# Patient Record
Sex: Female | Born: 2000 | Race: White | Hispanic: No | Marital: Single | Smoking: Never smoker
Health system: Southern US, Community
[De-identification: ages and names within clinical notes are randomized; demographics above are authoritative.]

## PROBLEM LIST (undated history)

## (undated) ENCOUNTER — Inpatient Hospital Stay (HOSPITAL_COMMUNITY): Payer: Self-pay

## (undated) DIAGNOSIS — F32A Depression, unspecified: Secondary | ICD-10-CM

## (undated) DIAGNOSIS — I10 Essential (primary) hypertension: Secondary | ICD-10-CM

## (undated) DIAGNOSIS — R519 Headache, unspecified: Secondary | ICD-10-CM

## (undated) DIAGNOSIS — L0100 Impetigo, unspecified: Secondary | ICD-10-CM

## (undated) DIAGNOSIS — J45909 Unspecified asthma, uncomplicated: Secondary | ICD-10-CM

## (undated) DIAGNOSIS — F419 Anxiety disorder, unspecified: Secondary | ICD-10-CM

## (undated) DIAGNOSIS — F329 Major depressive disorder, single episode, unspecified: Secondary | ICD-10-CM

## (undated) DIAGNOSIS — R51 Headache: Secondary | ICD-10-CM

## (undated) HISTORY — PX: TONSILLECTOMY: SUR1361

---
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
## 2019-05-27 ENCOUNTER — Encounter (HOSPITAL_BASED_OUTPATIENT_CLINIC_OR_DEPARTMENT_OTHER): Payer: Self-pay | Admitting: *Deleted

## 2019-05-27 ENCOUNTER — Other Ambulatory Visit: Payer: Self-pay

## 2019-05-27 ENCOUNTER — Emergency Department (HOSPITAL_BASED_OUTPATIENT_CLINIC_OR_DEPARTMENT_OTHER)
Admission: EM | Admit: 2019-05-27 | Discharge: 2019-05-28 | Disposition: A | Discharge status: Home / Self Care | Payer: 59 | Attending: Emergency Medicine | Admitting: Emergency Medicine

## 2019-05-27 DIAGNOSIS — J45909 Unspecified asthma, uncomplicated: Secondary | ICD-10-CM | POA: Diagnosis not present

## 2019-05-27 DIAGNOSIS — R1013 Epigastric pain: Secondary | ICD-10-CM | POA: Insufficient documentation

## 2019-05-27 DIAGNOSIS — Z79899 Other long term (current) drug therapy: Secondary | ICD-10-CM | POA: Insufficient documentation

## 2019-05-27 LAB — LIPASE, BLOOD: Lipase: 17 U/L (ref 11–51)

## 2019-05-27 LAB — COMPREHENSIVE METABOLIC PANEL
ALT: 13 U/L (ref 0–44)
AST: 12 U/L — ABNORMAL LOW (ref 15–41)
Albumin: 4.1 g/dL (ref 3.5–5.0)
Alkaline Phosphatase: 82 U/L (ref 38–126)
Anion gap: 11 (ref 5–15)
BUN: 14 mg/dL (ref 6–20)
CO2: 22 mmol/L (ref 22–32)
Calcium: 8.8 mg/dL — ABNORMAL LOW (ref 8.9–10.3)
Chloride: 103 mmol/L (ref 98–111)
Creatinine, Ser: 0.75 mg/dL (ref 0.44–1.00)
GFR calc Af Amer: >60 mL/min (ref >60)
GFR calc non Af Amer: >60 mL/min (ref >60)
Glucose, Bld: 92 mg/dL (ref 70–99)
Potassium: 3.8 mmol/L (ref 3.5–5.1)
Sodium: 136 mmol/L (ref 135–145)
Total Bilirubin: 0.4 mg/dL (ref 0.3–1.2)
Total Protein: 6.8 g/dL (ref 6.5–8.1)

## 2019-05-27 LAB — URINALYSIS, ROUTINE W REFLEX MICROSCOPIC
Bilirubin Urine: NEGATIVE
Glucose, UA: NEGATIVE mg/dL
Hgb urine dipstick: NEGATIVE
Ketones, ur: NEGATIVE mg/dL
Leukocytes,Ua: NEGATIVE
Nitrite: NEGATIVE
Protein, ur: NEGATIVE mg/dL
Specific Gravity, Urine: 1.02 (ref 1.005–1.030)
pH: 6 (ref 5.0–8.0)

## 2019-05-27 LAB — PREGNANCY, URINE: Preg Test, Ur: NEGATIVE

## 2019-05-27 MED ORDER — KETOROLAC TROMETHAMINE 30 MG/ML IJ SOLN
30.0000 mg | Freq: Once | INTRAMUSCULAR | Status: DC
  Filled 2019-05-27: qty 1

## 2019-05-27 MED ORDER — DICYCLOMINE HCL 10 MG PO CAPS
10.0000 mg | ORAL_CAPSULE | Freq: Once | ORAL | Status: AC
  Administered 2019-05-28: 10 mg via ORAL
  Filled 2019-05-27: qty 1

## 2019-05-27 MED ORDER — LIDOCAINE HCL 1 % IJ SOLN
INTRAMUSCULAR | Status: AC
  Filled 2019-05-27: qty 20

## 2019-05-27 NOTE — ED Provider Notes (Signed)
[facility] EMERGENCY DEPARTMENT Provider Note   CSN: [id number] Arrival date & time: 05/27/19  2001     History Chief Complaint  Patient presents with  . Abdominal Pain    fever    [name] is a 19 y.o. female.  HPI     This is a 19 year old female with a history of anxiety and asthma who presents with abdominal pain.  Patient reports upper crampy abdominal pain that started this morning.  It comes and goes.  It does not seem to be worsened by anything including eating.  Patient currently rates her pain at 5 out of 10.  She did not take anything for pain.  She reports urinary frequency but no dysuria or hematuria.  Denies any vaginal discharge or concerns for STDs.  Did take her temperature earlier today it was 102 temporally.  Denies any sick contacts.  Denies nausea, vomiting, chest pain, shortness of breath, cough.  No Covid contacts.  She reports normal bowel movements.  Past Medical History:  Diagnosis Date  . Anxiety   . Asthma   . Depression   . Headache    hormone induced  . Impetigo     Patient Active Problem List   Diagnosis Date Noted  . Postpartum endometritis 04/25/2017  . Anemia due to acute blood loss 04/25/2017  . Status post primary low transverse cesarean section 04/24/2017  . Preeclampsia, severe 04/19/2017    Past Surgical History:  Procedure Laterality Date  . CESAREAN SECTION N/A 04/21/2017   Procedure: CESAREAN SECTION;  Surgeon: Geryl Rankins, MD;  Location: [facility] BIRTHING SUITES;  Service: Obstetrics;  Laterality: N/A;     OB History    Gravida  1   Para  1   Term  1   Preterm  0   AB  0   Living  1     SAB  0   TAB  0   Ectopic  0   Multiple  0   Live Births  1           Family History  Problem Relation Age of Onset  . Hypertension Maternal Grandmother   . Arthritis Maternal Grandmother   . Depression Maternal Grandmother   . Heart disease Maternal Grandfather   . Hypertension Maternal  Grandfather   . Anemia Mother     Social History   Tobacco Use  . Smoking status: Never Smoker  . Smokeless tobacco: Never Used  Substance Use Topics  . Alcohol use: No  . Drug use: No    Home Medications Prior to Admission medications   Medication Sig Start Date End Date Taking? Authorizing Provider  acetaminophen (TYLENOL) 500 MG tablet Take 500 mg by mouth every 6 (six) hours as needed for mild pain or headache.    [provider]  albuterol (PROVENTIL HFA;VENTOLIN HFA) 108 (90 Base) MCG/ACT inhaler Inhale 1-2 puffs into the lungs every 4 (four) hours as needed for wheezing or shortness of breath.     [provider]  ibuprofen (ADVIL,MOTRIN) 600 MG tablet Take 1 tablet (600 mg total) by mouth every 6 (six) hours as needed. 04/25/17   Nigel Bridgeman, CNM  Iron-FA-B Cmp-C-Biot-Probiotic (FUSION PLUS) CAPS Take 1 tablet by mouth daily. 04/25/17   Nigel Bridgeman, CNM  NIFEdipine (PROCARDIA-XL/ADALAT CC) 30 MG 24 hr tablet Take 1 tablet (30 mg total) by mouth daily. 04/25/17   Nigel Bridgeman, CNM  omeprazole (PRILOSEC) 20 MG capsule Take 1 capsule (20 mg total)  by mouth daily. 05/28/19   Crosby Oriordan, Mayer Masker, MD  Prenatal MV-Min-FA-Omega-3 (PRENATAL GUMMIES/DHA & FA) 0.4-32.5 MG CHEW Chew 1 each by mouth at bedtime.    [provider]    Allergies    Penicillins  Review of Systems   Review of Systems  Constitutional: Positive for fever.  Respiratory: Negative for shortness of breath.   Cardiovascular: Negative for chest pain.  Gastrointestinal: Positive for abdominal pain. Negative for constipation, nausea and vomiting.  Genitourinary: Positive for frequency. Negative for difficulty urinating, urgency and vaginal discharge.  All other systems reviewed and are negative.   Physical Exam Updated Vital Signs BP (!) 163/71 (BP Location: Right Arm)   Pulse 93   Temp 99 F (37.2 C) (Oral)   Resp 20   Ht 1.6 m (5\' 3" )   Wt 97.5 kg   LMP 05/13/2019 (Approximate)    SpO2 100%   Breastfeeding No   BMI 38.09 kg/m   Physical Exam Vitals and nursing note reviewed.  Constitutional:      Appearance: She is well-developed. She is obese. She is not ill-appearing.  HENT:     Head: Normocephalic and atraumatic.  Eyes:     Pupils: Pupils are equal, round, and reactive to light.  Cardiovascular:     Rate and Rhythm: Normal rate and regular rhythm.     Heart sounds: Normal heart sounds.  Pulmonary:     Effort: Pulmonary effort is normal. No respiratory distress.     Breath sounds: No wheezing.  Abdominal:     General: Bowel sounds are normal.     Palpations: Abdomen is soft.     Tenderness: There is abdominal tenderness in the epigastric area. There is no guarding or rebound.  Musculoskeletal:     Cervical back: Neck supple.  Skin:    General: Skin is warm and dry.  Neurological:     Mental Status: She is alert and oriented to person, place, and time.  Psychiatric:        Mood and Affect: Mood normal.     ED Results / Procedures / Treatments   Labs (all labs ordered are listed, but only abnormal results are displayed) Labs Reviewed  COMPREHENSIVE METABOLIC PANEL - Abnormal; Notable for the following components:      Result Value   Calcium 8.8 (*)    AST 12 (*)    All other components within normal limits  LIPASE, BLOOD  URINALYSIS, ROUTINE W REFLEX MICROSCOPIC  PREGNANCY, URINE  CBC    EKG None  Radiology No results found.  Procedures Procedures (including critical care time)  Medications Ordered in ED Medications  dicyclomine (BENTYL) capsule 10 mg (10 mg Oral Given 05/28/19 0011)  ketorolac (TORADOL) 30 MG/ML injection 30 mg (30 mg Intravenous Given 05/28/19 0013)    ED Course  I have reviewed the triage vital signs and the nursing notes.  Pertinent labs & imaging results that were available during my care of the patient were reviewed by me and considered in my medical decision making (see chart for details).    MDM  Rules/Calculators/A&P                       Patient presents with upper abdominal pain.  She is overall nontoxic-appearing vital signs are reassuring.  She reports fever at home.  She is afebrile here.  She has some epigastric tenderness to palpation without rebound or guarding.  Lab work reviewed.  No significant leukocytosis.  LFTs and urinalysis are reassuring.  Considerations include reflux, gastritis, pancreatitis, cholecystitis, less likely appendicitis or ovarian pathology.  She has no evidence of inflammation and feel that cholecystitis is unlikely.  Lipase is normal.  Given reported fever, patient could have a viral illness.  Patient improved with Toradol and Bentyl.  I apparently indicated to nursing vaginal discharge but declines pelvic exam and does not report any vaginal discharge to me.  We discussed starting omeprazole to see if this improves her symptoms.  Patient stated understanding.  She is given strict return precautions.  After history, exam, and medical workup I feel the patient has been appropriately medically screened and is safe for discharge home. Pertinent diagnoses were discussed with the patient. Patient was given return precautions.   Final Clinical Impression(s) / ED Diagnoses Final diagnoses:  Epigastric pain    Rx / DC Orders ED Discharge Orders         Ordered    omeprazole (PRILOSEC) 20 MG capsule  Daily     05/28/19 0114           Merryl Hacker, MD 05/28/19 [phone]

## 2019-05-27 NOTE — ED Triage Notes (Signed)
Pt reports abd cramping, vaginal discharge, and urinary frequency today. States she checked her temp with a forehead thermometer and it was 101.8 earlier today

## 2019-05-28 LAB — CBC
HCT: 37.5 % (ref 36.0–46.0)
Hemoglobin: 12.5 g/dL (ref 12.0–15.0)
MCH: 29 pg (ref 26.0–34.0)
MCHC: 33.3 g/dL (ref 30.0–36.0)
MCV: 87 fL (ref 80.0–100.0)
Platelets: 267 10*3/uL (ref 150–400)
RBC: 4.31 MIL/uL (ref 3.87–5.11)
RDW: 12.6 % (ref 11.5–15.5)
WBC: 8.2 10*3/uL (ref 4.0–10.5)
nRBC: 0 % (ref 0.0–0.2)

## 2019-05-28 MED ORDER — KETOROLAC TROMETHAMINE 30 MG/ML IJ SOLN
30.0000 mg | Freq: Once | INTRAMUSCULAR | Status: AC
  Administered 2019-05-28: 30 mg via INTRAVENOUS

## 2019-05-28 MED ORDER — OMEPRAZOLE 20 MG PO CPDR: 20.0000 mg | DELAYED_RELEASE_CAPSULE | Freq: Every day | ORAL | 0 refills | Status: DC

## 2019-05-28 NOTE — Discharge Instructions (Addendum)
You were seen today for abdominal pain.  Your abdominal pain is in the upper abdomen.  Take omeprazole daily.  See if this helps.  If you develop worsening pain, right upper quadrant pain, pain that moves to the right lower quadrant, you should be reevaluated.  Make sure that you are staying hydrated.

## 2020-08-18 ENCOUNTER — Emergency Department (HOSPITAL_COMMUNITY)
Admission: EM | Admit: 2020-08-18 | Discharge: 2020-08-19 | Disposition: A | Discharge status: Home / Self Care | Payer: Managed Care, Other (non HMO) | Attending: Emergency Medicine | Admitting: Emergency Medicine

## 2020-08-18 ENCOUNTER — Other Ambulatory Visit: Payer: Self-pay

## 2020-08-18 DIAGNOSIS — R1031 Right lower quadrant pain: Secondary | ICD-10-CM

## 2020-08-18 DIAGNOSIS — J45909 Unspecified asthma, uncomplicated: Secondary | ICD-10-CM | POA: Diagnosis not present

## 2020-08-18 LAB — I-STAT BETA HCG BLOOD, ED (MC, WL, AP ONLY): I-stat hCG, quantitative: <5 m[IU]/mL (ref <5)

## 2020-08-18 LAB — URINALYSIS, ROUTINE W REFLEX MICROSCOPIC
Bacteria, UA: NONE SEEN
Bilirubin Urine: NEGATIVE
Glucose, UA: NEGATIVE mg/dL
Hgb urine dipstick: NEGATIVE
Ketones, ur: NEGATIVE mg/dL
Nitrite: NEGATIVE
Protein, ur: NEGATIVE mg/dL
Specific Gravity, Urine: 1.01 (ref 1.005–1.030)
pH: 5 (ref 5.0–8.0)

## 2020-08-18 LAB — COMPREHENSIVE METABOLIC PANEL
ALT: 16 U/L (ref 0–44)
AST: 15 U/L (ref 15–41)
Albumin: 3.8 g/dL (ref 3.5–5.0)
Alkaline Phosphatase: 81 U/L (ref 38–126)
Anion gap: 10 (ref 5–15)
BUN: 10 mg/dL (ref 6–20)
CO2: 23 mmol/L (ref 22–32)
Calcium: 9.2 mg/dL (ref 8.9–10.3)
Chloride: 102 mmol/L (ref 98–111)
Creatinine, Ser: 0.81 mg/dL (ref 0.44–1.00)
GFR, Estimated: >60 mL/min (ref >60)
Glucose, Bld: 92 mg/dL (ref 70–99)
Potassium: 3.9 mmol/L (ref 3.5–5.1)
Sodium: 135 mmol/L (ref 135–145)
Total Bilirubin: 0.4 mg/dL (ref 0.3–1.2)
Total Protein: 6.9 g/dL (ref 6.5–8.1)

## 2020-08-18 LAB — CBC WITH DIFFERENTIAL/PLATELET
Abs Immature Granulocytes: 0.03 10*3/uL (ref 0.00–0.07)
Basophils Absolute: 0 10*3/uL (ref 0.0–0.1)
Basophils Relative: 0 %
Eosinophils Absolute: 0 10*3/uL (ref 0.0–0.5)
Eosinophils Relative: 0 %
HCT: 37.9 % (ref 36.0–46.0)
Hemoglobin: 12.4 g/dL (ref 12.0–15.0)
Immature Granulocytes: 0 %
Lymphocytes Relative: 13 %
Lymphs Abs: 1.3 10*3/uL (ref 0.7–4.0)
MCH: 28.7 pg (ref 26.0–34.0)
MCHC: 32.7 g/dL (ref 30.0–36.0)
MCV: 87.7 fL (ref 80.0–100.0)
Monocytes Absolute: 1 10*3/uL (ref 0.1–1.0)
Monocytes Relative: 10 %
Neutro Abs: 7.9 10*3/uL — ABNORMAL HIGH (ref 1.7–7.7)
Neutrophils Relative %: 77 %
Platelets: 239 10*3/uL (ref 150–400)
RBC: 4.32 MIL/uL (ref 3.87–5.11)
RDW: 12.8 % (ref 11.5–15.5)
WBC: 10.2 10*3/uL (ref 4.0–10.5)
nRBC: 0 % (ref 0.0–0.2)

## 2020-08-18 LAB — LIPASE, BLOOD: Lipase: 24 U/L (ref 11–51)

## 2020-08-18 MED ORDER — ONDANSETRON HCL 4 MG/2ML IJ SOLN
4.0000 mg | Freq: Once | INTRAMUSCULAR | Status: AC
  Administered 2020-08-18: 4 mg via INTRAVENOUS
  Filled 2020-08-18: qty 2

## 2020-08-18 MED ORDER — SODIUM CHLORIDE 0.9 % IV BOLUS
1000.0000 mL | Freq: Once | INTRAVENOUS | Status: AC
  Administered 2020-08-18: 1000 mL via INTRAVENOUS

## 2020-08-18 MED ORDER — ACETAMINOPHEN 325 MG PO TABS
650.0000 mg | ORAL_TABLET | Freq: Once | ORAL | Status: AC
  Administered 2020-08-18: 650 mg via ORAL
  Filled 2020-08-18: qty 2

## 2020-08-18 NOTE — ED Triage Notes (Signed)
Patient reports RLQ pain with fever, chills , nausea and headache onset Thursday , seen at an urgent care sent here to rule out appendicitis .

## 2020-08-18 NOTE — ED Provider Notes (Signed)
[facility] EMERGENCY DEPARTMENT Provider Note   CSN: [id number] Arrival date & time: 08/18/20  1834     History Chief Complaint  Patient presents with   Abdominal Pain    [name] is a 20 y.o. female.  20 year old female presents to the emergency department for evaluation of lower abdominal pain.  She has been having pain for the past few days.  Reports constant pain which has been gradually worsening.  She went to urgent care who encouraged ED assessment given concern for appendicitis.  The patient states that she has been having frequent nausea.  Had a fever last night.  Currently complaining of a headache.  No vomiting, diarrhea, dysuria, hematuria. SHx significant for C-section. Secondary amenorrhea 2/2 IUD.   Abdominal Pain     Past Medical History:  Diagnosis Date   Anxiety    Asthma    Depression    Headache    hormone induced   Impetigo     Patient Active Problem List   Diagnosis Date Noted   Postpartum endometritis 04/25/2017   Anemia due to acute blood loss 04/25/2017   Status post primary low transverse cesarean section 04/24/2017   Preeclampsia, severe 04/19/2017    Past Surgical History:  Procedure Laterality Date   CESAREAN SECTION N/A 04/21/2017   Procedure: CESAREAN SECTION;  Surgeon: Geryl Rankins, MD;  Location: [facility] BIRTHING SUITES;  Service: Obstetrics;  Laterality: N/A;     OB History     Gravida  1   Para  1   Term  1   Preterm  0   AB  0   Living  1      SAB  0   IAB  0   Ectopic  0   Multiple  0   Live Births  1           Family History  Problem Relation Age of Onset   Hypertension Maternal Grandmother    Arthritis Maternal Grandmother    Depression Maternal Grandmother    Heart disease Maternal Grandfather    Hypertension Maternal Grandfather    Anemia Mother     Social History   Tobacco Use   Smoking status: Never   Smokeless tobacco: Never  Substance Use Topics    Alcohol use: No   Drug use: No    Home Medications Prior to Admission medications   Medication Sig Start Date End Date Taking? Authorizing Provider  acetaminophen (TYLENOL) 500 MG tablet Take 500 mg by mouth every 6 (six) hours as needed for mild pain or headache.   Yes [provider]  albuterol (PROVENTIL HFA;VENTOLIN HFA) 108 (90 Base) MCG/ACT inhaler Inhale 1-2 puffs into the lungs every 4 (four) hours as needed for wheezing or shortness of breath.    Yes [provider]  busPIRone (BUSPAR) 5 MG tablet Take 5 mg by mouth in the morning and at bedtime. 08/12/20 09/11/20 Yes [provider]  levonorgestrel (MIRENA, 52 MG,) 20 MCG/DAY IUD Mirena 20 mcg/24 hours (7 yrs) 52 mg intrauterine device  Take 1 device by intrauterine route.  pt tolerated well   Yes [provider]  naproxen (NAPROSYN) 500 MG tablet Take 1 tablet (500 mg total) by mouth every 12 (twelve) hours as needed for moderate pain or mild pain (Take with food). 08/19/20  Yes [name], PA-C  ondansetron (ZOFRAN ODT) 4 MG disintegrating tablet Take 1 tablet (4 mg total) by mouth every 8 (eight) hours as needed for  nausea or vomiting. 08/19/20  Yes [name], PA-C  NIFEdipine (PROCARDIA-XL/ADALAT CC) 30 MG 24 hr tablet Take 1 tablet (30 mg total) by mouth daily. Patient not taking: No sig reported 04/25/17   Nigel Bridgeman, CNM    Allergies    Penicillins  Review of Systems   Review of Systems  Gastrointestinal:  Positive for abdominal pain.  Ten systems reviewed and are negative for acute change, except as noted in the HPI.    Physical Exam Updated Vital Signs BP 115/62 (BP Location: Right Arm)   Pulse 96   Temp 99.6 F (37.6 C) (Oral)   Resp 16   Ht 5\' 3"  (1.6 m)   Wt 120 kg   SpO2 98%   BMI 46.86 kg/m   Physical Exam Vitals and nursing note reviewed.  Constitutional:      General: She is not in acute distress.    Appearance: She is well-developed. She is not diaphoretic.      Comments: Nontoxic appearing and in NAD  HENT:     Head: Normocephalic and atraumatic.  Eyes:     General: No scleral icterus.    Conjunctiva/sclera: Conjunctivae normal.  Pulmonary:     Effort: Pulmonary effort is normal. No respiratory distress.     Comments: Respirations even and unlabored Abdominal:     Palpations: There is no mass.     Tenderness: There is abdominal tenderness.     Comments: RLQ and suprapubic TTP. Abdomen soft, obese. No peritoneal signs.  Musculoskeletal:        General: Normal range of motion.     Cervical back: Normal range of motion.  Skin:    General: Skin is warm and dry.     Coloration: Skin is not pale.     Findings: No erythema or rash.  Neurological:     Mental Status: She is alert and oriented to person, place, and time.     Coordination: Coordination normal.  Psychiatric:        Behavior: Behavior normal.    ED Results / Procedures / Treatments   Labs (all labs ordered are listed, but only abnormal results are displayed) Labs Reviewed  CBC WITH DIFFERENTIAL/PLATELET - Abnormal; Notable for the following components:      Result Value   Neutro Abs 7.9 (*)    All other components within normal limits  URINALYSIS, ROUTINE W REFLEX MICROSCOPIC - Abnormal; Notable for the following components:   APPearance HAZY (*)    Leukocytes,Ua SMALL (*)    All other components within normal limits  COMPREHENSIVE METABOLIC PANEL  LIPASE, BLOOD  I-STAT BETA HCG BLOOD, ED ([facility], [facility], [facility] ONLY)    EKG None  Radiology CT ABDOMEN PELVIS W CONTRAST  Result Date: 08/19/2020 CLINICAL DATA:  Right lower quadrant abdominal pain EXAM: CT ABDOMEN AND PELVIS WITH CONTRAST TECHNIQUE: Multidetector CT imaging of the abdomen and pelvis was performed using the standard protocol following bolus administration of intravenous contrast. CONTRAST:  08/21/2020 OMNIPAQUE IOHEXOL 300 MG/ML  SOLN COMPARISON:  None. FINDINGS: Lower chest: No acute abnormality. Hepatobiliary: No  focal liver abnormality is seen. No gallstones, gallbladder wall thickening, or biliary dilatation. Pancreas: Unremarkable Spleen: Unremarkable Adrenals/Urinary Tract: Adrenal glands are unremarkable. Kidneys are normal, without renal calculi, focal lesion, or hydronephrosis. Bladder is unremarkable. Stomach/Bowel: The stomach, small bowel, and large bowel are unremarkable. No evidence of obstruction or focal inflammation. The appendix is normal. There is shotty mesenteric adenopathy within the ileocecal lymph node group, possibly reactive in  nature. No frankly pathologic mesenteric adenopathy identified. No free intraperitoneal gas or fluid. Vascular/Lymphatic: No pathologic adenopathy within the abdomen and pelvis. The abdominal vasculature is unremarkable. Reproductive: Intrauterine device in expected position. The pelvic organs are otherwise unremarkable. Other: Tiny fat containing umbilical hernia. Musculoskeletal: No acute bone abnormality. IMPRESSION: Shotty adenopathy within the ileocecal lymph node group within the right lower quadrant of the abdomen, possibly reactive in nature. No frankly pathologic adenopathy. Normal appendix. Electronically Signed   By: Helyn Numbers MD   On: 08/19/2020 00:42    Procedures Procedures   Medications Ordered in ED Medications  acetaminophen (TYLENOL) tablet 650 mg (650 mg Oral Given 08/18/20 2302)  sodium chloride 0.9 % bolus 1,000 mL (0 mLs Intravenous Stopped 08/19/20 0150)  ondansetron (ZOFRAN) injection 4 mg (4 mg Intravenous Given 08/18/20 2302)  iohexol (OMNIPAQUE) 300 MG/ML solution 100 mL (100 mLs Intravenous Contrast Given 08/19/20 0032)  ketorolac (TORADOL) 15 MG/ML injection 15 mg (15 mg Intravenous Given 08/19/20 0056)    ED Course  I have reviewed the triage vital signs and the nursing notes.  Pertinent labs & imaging results that were available during my care of the patient were reviewed by me and considered in my medical decision making (see  chart for details).    MDM Rules/Calculators/A&P                          20 year old female presents to the emergency department for evaluation of lower abdominal pain.  Was advised to come to the ED after urgent care evaluation to rule out appendicitis.  Noted to have focal tenderness in the lower abdomen.  Laboratory evaluation and urinalysis today are reassuring.  CT ordered following medical screening in triage.  This shows findings consistent with adenitis.  Pregnancy is negative ruling out ectopic.  Chronicity of her symptoms makes ovarian torsion unlikely.  Her pelvic organs  are without acute change.  She is feeling better after receiving fluids, Zofran, Toradol.  Counseled on outpatient use of NSAIDs as well as primary care follow-up.   Final Clinical Impression(s) / ED Diagnoses Final diagnoses:  Right lower quadrant abdominal pain    Rx / DC Orders ED Discharge Orders          Ordered    naproxen (NAPROSYN) 500 MG tablet  Every 12 hours PRN        08/19/20 0142    ondansetron (ZOFRAN ODT) 4 MG disintegrating tablet  Every 8 hours PRN        08/19/20 0142             [name], PA-C 08/19/20 0201    Palumbo, April, MD 08/19/20 0205

## 2020-08-18 NOTE — ED Provider Notes (Signed)
Emergency Medicine Provider Triage Evaluation Note  [name] , a 20 y.o. female  was evaluated in triage.  Pt complains of ad pain for the last several days. Reports fevers and nausea.  Review of Systems  Positive: Abd pain, fever, nausea Negative: Vomiting, diarrhea  Physical Exam  There were no vitals taken for this visit. Gen:   Awake, no distress   Resp:  Normal effort  MSK:   Moves extremities without difficulty  Other:  Rlq ttp  Medical Decision Making  Medically screening exam initiated at 7:22 PM.  Appropriate orders placed.  [name] was informed that the remainder of the evaluation will be completed by another provider, this initial triage assessment does not replace that evaluation, and the importance of remaining in the ED until their evaluation is complete.     [name] 08/18/20 1923    Tracy Files, MD 08/19/20 1019

## 2020-08-19 ENCOUNTER — Emergency Department (HOSPITAL_COMMUNITY): Payer: Managed Care, Other (non HMO)

## 2020-08-19 MED ORDER — IOHEXOL 300 MG/ML  SOLN
100.0000 mL | Freq: Once | INTRAMUSCULAR | Status: AC | PRN
  Administered 2020-08-19: 100 mL via INTRAVENOUS

## 2020-08-19 MED ORDER — ONDANSETRON 4 MG PO TBDP: 4.0000 mg | ORAL_TABLET | Freq: Three times a day (TID) | ORAL | 0 refills | Status: DC | PRN

## 2020-08-19 MED ORDER — KETOROLAC TROMETHAMINE 15 MG/ML IJ SOLN
15.0000 mg | Freq: Once | INTRAMUSCULAR | Status: AC
  Administered 2020-08-19: 15 mg via INTRAVENOUS
  Filled 2020-08-19: qty 1

## 2020-08-19 MED ORDER — NAPROXEN 500 MG PO TABS: 500.0000 mg | ORAL_TABLET | Freq: Two times a day (BID) | ORAL | 0 refills | Status: DC | PRN

## 2020-08-19 NOTE — Discharge Instructions (Addendum)
We recommend Naproxen for management of pain. Take Zofran for nausea/vomiting. Follow up with a primary care doctor.

## 2020-08-19 NOTE — ED Notes (Signed)
Patient transported to CT 

## 2020-12-07 ENCOUNTER — Other Ambulatory Visit: Payer: Self-pay

## 2020-12-07 DIAGNOSIS — I10 Essential (primary) hypertension: Secondary | ICD-10-CM | POA: Insufficient documentation

## 2020-12-07 DIAGNOSIS — J45909 Unspecified asthma, uncomplicated: Secondary | ICD-10-CM | POA: Insufficient documentation

## 2020-12-07 DIAGNOSIS — Z7951 Long term (current) use of inhaled steroids: Secondary | ICD-10-CM | POA: Insufficient documentation

## 2020-12-07 DIAGNOSIS — R519 Headache, unspecified: Secondary | ICD-10-CM | POA: Diagnosis present

## 2020-12-08 ENCOUNTER — Emergency Department (HOSPITAL_BASED_OUTPATIENT_CLINIC_OR_DEPARTMENT_OTHER)
Admission: EM | Admit: 2020-12-08 | Discharge: 2020-12-08 | Disposition: A | Discharge status: Home / Self Care | Payer: Managed Care, Other (non HMO) | Attending: Emergency Medicine | Admitting: Emergency Medicine

## 2020-12-08 ENCOUNTER — Encounter (HOSPITAL_BASED_OUTPATIENT_CLINIC_OR_DEPARTMENT_OTHER): Payer: Self-pay | Admitting: *Deleted

## 2020-12-08 DIAGNOSIS — R030 Elevated blood-pressure reading, without diagnosis of hypertension: Secondary | ICD-10-CM

## 2020-12-08 NOTE — ED Provider Notes (Signed)
[facility] Provider Note: Tracy Dell, MD, FACEP  CSN: [id number] MRN: [id number] ARRIVAL: 12/07/20 at 2350 ROOM: MH05/MH05   CHIEF COMPLAINT  Hypertension   HISTORY OF PRESENT ILLNESS  12/08/20 2:38 AM [name] is a 20 y.o. female who has been having headaches for about the past 2 weeks.  The headaches are frontal and not severe.  They are amenable to over-the-counter analgesics.  Nevertheless she has been taking her blood pressure more frequently out of concern that her blood pressure may be elevated and causing her headaches.  Her blood pressure was as high as 154/99 at home (she had been checking it about every 15 minutes) and she became concerned and came to the ED.  Although the nurses notes indicate she has been having chest pain she states this is really a discomfort in her upper chest/lower neck that accompanies her headaches and she acknowledges it may be more of a sensation of anxiety than actual pain.  She is not having chest pain or shortness of breath at the present time.  On recheck her blood pressure is 122/56 in the emergency department.   Past Medical History:  Diagnosis Date   Anxiety    Asthma    Depression    Headache    hormone induced   Impetigo     Past Surgical History:  Procedure Laterality Date   CESAREAN SECTION N/A 04/21/2017   Procedure: CESAREAN SECTION;  Surgeon: Geryl Rankins, MD;  Location: [facility];  Service: Obstetrics;  Laterality: N/A;   TONSILLECTOMY      Family History  Problem Relation Age of Onset   Hypertension Maternal Grandmother    Arthritis Maternal Grandmother    Depression Maternal Grandmother    Heart disease Maternal Grandfather    Hypertension Maternal Grandfather    Anemia Mother     Social History   Tobacco Use   Smoking status: Never   Smokeless tobacco: Never  Vaping Use   Vaping Use: Every day  Substance Use Topics   Alcohol use: No   Drug use: No    Prior to  Admission medications   Medication Sig Start Date End Date Taking? Authorizing Provider  acetaminophen (TYLENOL) 500 MG tablet Take 500 mg by mouth every 6 (six) hours as needed for mild pain or headache.    [provider]  albuterol (PROVENTIL HFA;VENTOLIN HFA) 108 (90 Base) MCG/ACT inhaler Inhale 1-2 puffs into the lungs every 4 (four) hours as needed for wheezing or shortness of breath.     [provider]  levonorgestrel (MIRENA, 52 MG,) 20 MCG/DAY IUD Mirena 20 mcg/24 hours (7 yrs) 52 mg intrauterine device  Take 1 device by intrauterine route.  pt tolerated well    [provider]  naproxen (NAPROSYN) 500 MG tablet Take 1 tablet (500 mg total) by mouth every 12 (twelve) hours as needed for moderate pain or mild pain (Take with food). 08/19/20   [name], PA-C  ondansetron (ZOFRAN ODT) 4 MG disintegrating tablet Take 1 tablet (4 mg total) by mouth every 8 (eight) hours as needed for nausea or vomiting. 08/19/20   [name], PA-C    Allergies Penicillins   REVIEW OF SYSTEMS  Negative except as noted here or in the History of Present Illness.   PHYSICAL EXAMINATION  Initial Vital Signs Blood pressure 133/72, pulse 90, temperature 98.7 F (37.1 C), temperature source Oral, resp. rate 18, height 5\' 3"  (1.6 m), weight 114.3 kg, last menstrual  period 11/24/2020, SpO2 97 %.  Examination General: Well-developed, well-nourished female in no acute distress; appearance consistent with age of record HENT: normocephalic; atraumatic Eyes: pupils equal, round and reactive to light; extraocular muscles intact Neck: supple Heart: regular rate and rhythm Lungs: clear to auscultation bilaterally Abdomen: soft; nondistended; nontender; bowel sounds present Extremities: No deformity; full range of motion; pulses normal Neurologic: Awake, alert and oriented; motor function intact in all extremities and symmetric; no facial droop Skin: Warm and dry Psychiatric:  Normal mood and affect   RESULTS  Summary of this visit's results, reviewed and interpreted by myself:   EKG Interpretation  Date/Time:  Monday December 08 2020 00:02:26 EDT Ventricular Rate:  100 PR Interval:  142 QRS Duration: 90 QT Interval:  356 QTC Calculation: 459 R Axis:   69 Text Interpretation: Normal sinus rhythm Cannot rule out Anterior infarct , age undetermined Abnormal ECG No previous ECGs available Confirmed by Paula Libra ([id number]) on 12/08/2020 12:14:52 AM       Laboratory Studies: No results found for this or any previous visit (from the past 24 hour(s)). Imaging Studies: No results found.  ED COURSE and MDM  Nursing notes, initial and subsequent vitals signs, including pulse oximetry, reviewed and interpreted by myself.  Vitals:   12/08/20 0200 12/08/20 0215 12/08/20 0230 12/08/20 0245  BP: (!) 119/54 121/68 118/65 (!) 122/56  Pulse: 88 77 76 87  Resp:    18  Temp:      TempSrc:      SpO2: 98% 99% 98% 100%  Weight:      Height:       Medications - No data to display  The patient declined lab work stating that she plans to follow-up with her PCP for further evaluation.  She was advised to take her blood pressure on a regular basis and to keep a record of her readings.  She was advised not to worry over her blood pressure as this can in fact drive blood pressure up.  Also her headaches may be causing the blood pressure to go up as a response to pain rather than vice versa.  PROCEDURES  Procedures   ED DIAGNOSES     ICD-10-CM   1. Elevated blood pressure reading without diagnosis of hypertension  R03.0          [name], MD 12/08/20 [phone]

## 2020-12-08 NOTE — ED Notes (Signed)
Patient verbalizes understanding of discharge instructions. Opportunity for questioning and answers were provided. Armband removed by staff, pt discharged from ED. Ambulated out to lobby  

## 2020-12-08 NOTE — ED Triage Notes (Addendum)
Pt reports recurrent chest pain x 2 weeks. States BP has been elevated (154/99). Reports she has also been having headaches

## 2022-10-19 IMAGING — CT CT ABD-PELV W/ CM
2 of 4 series · 17 of 46 positions shown, 19 images · IV contrast (Omni 300)
Comparison: None.

CLINICAL DATA: Right lower quadrant abdominal pain

EXAM:
CT ABDOMEN AND PELVIS WITH CONTRAST
TECHNIQUE: Multidetector CT imaging of the abdomen and pelvis was performed
using the standard protocol following bolus administration of
intravenous contrast.
CONTRAST:  100mL OMNIPAQUE IOHEXOL 300 MG/ML  SOLN

[Series 3: a/p w/ 5mm · axial · 0.84mm/px · z∈[-535,-100]mm · 14 of 97 slices shown, 16 images]
[im 5/97  soft-tissue]
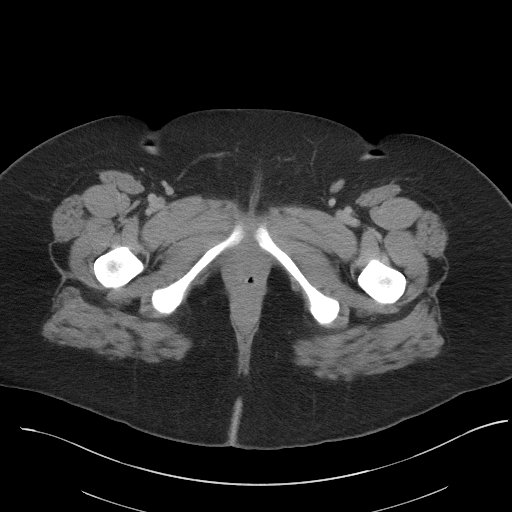
[im 5/97  bone]
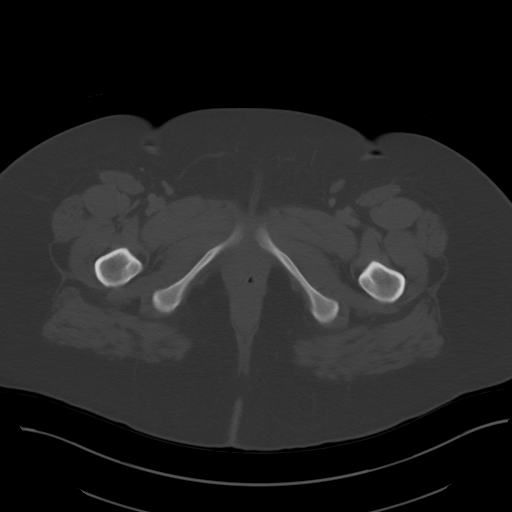
[im 13/97  soft-tissue]
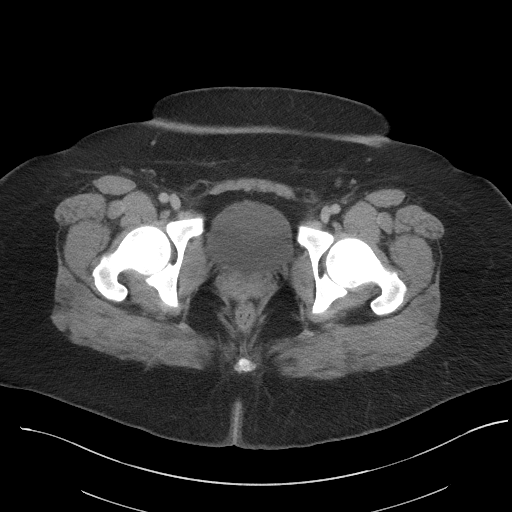
[im 17/97  soft-tissue]
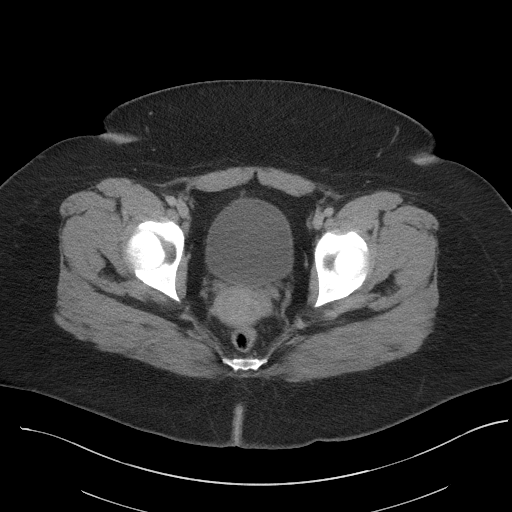
[im 26/97  soft-tissue]
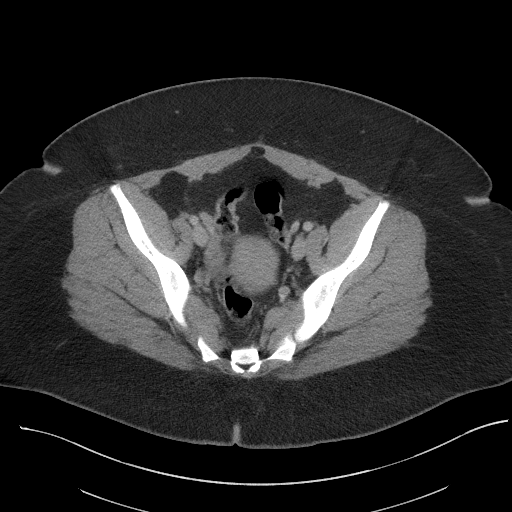
[im 34/97  soft-tissue]
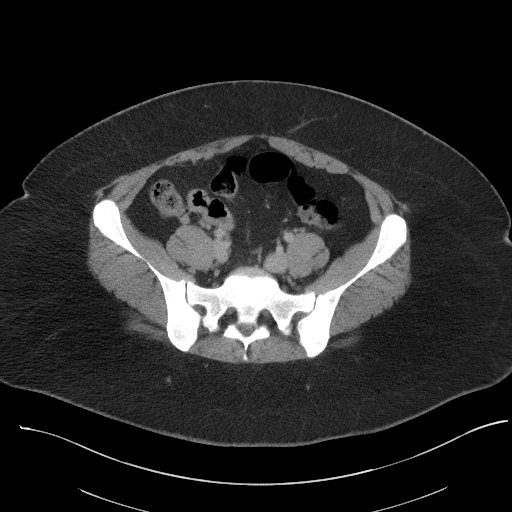
[im 38/97  soft-tissue]
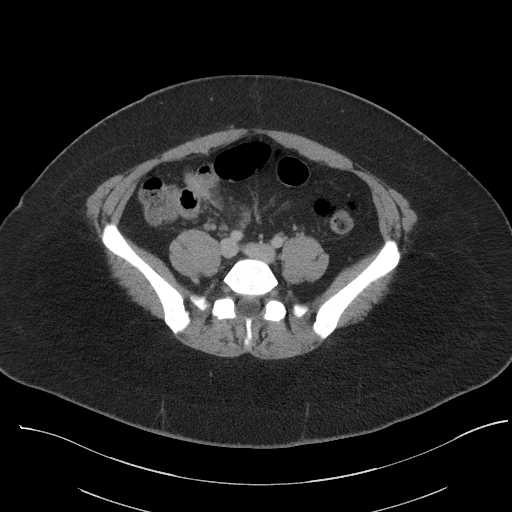
[im 46/97  soft-tissue]
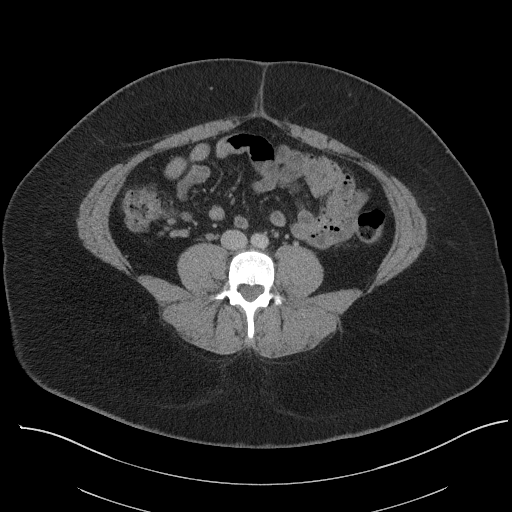
[im 51/97  soft-tissue]
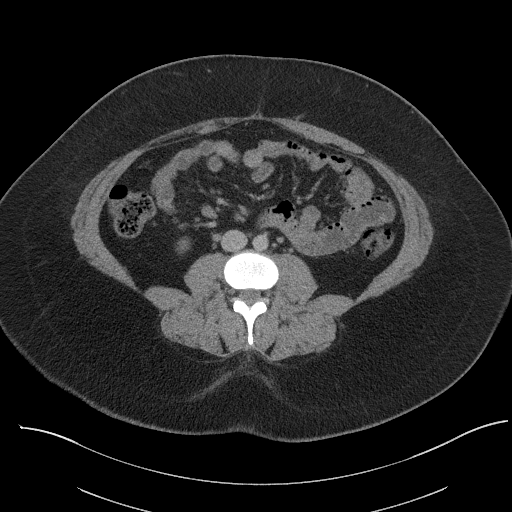
[im 59/97  soft-tissue]
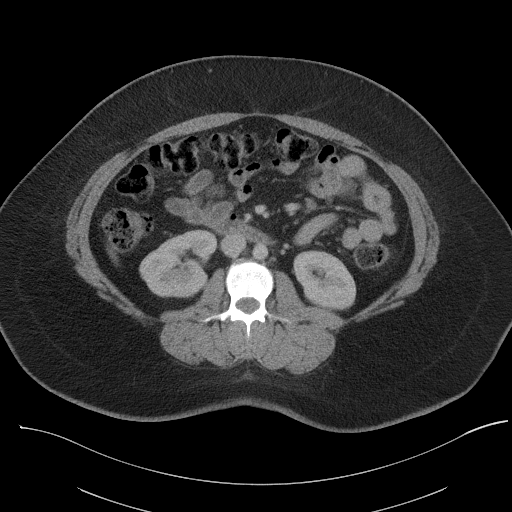
[im 59/97  bone]
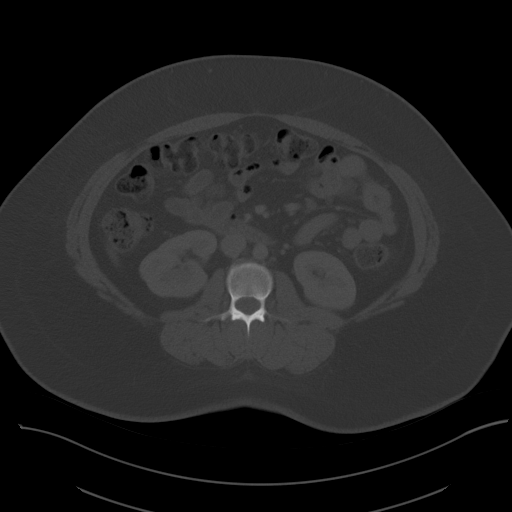
[im 63/97  soft-tissue]
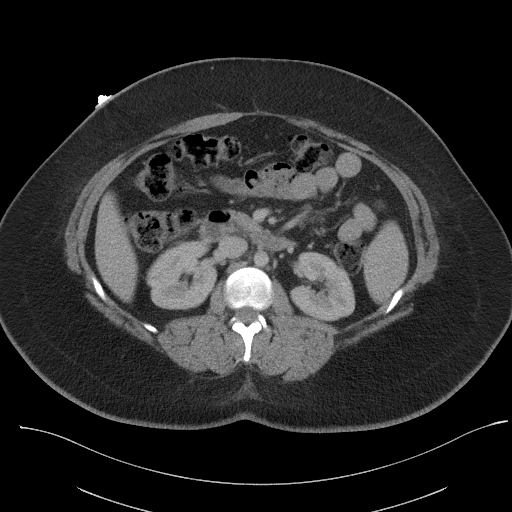
[im 71/97  soft-tissue]
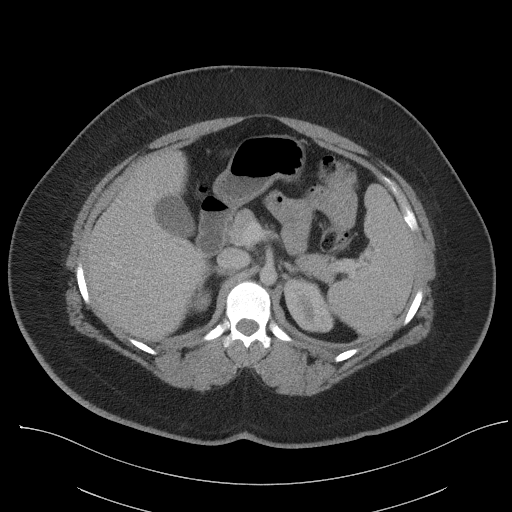
[im 80/97  soft-tissue]
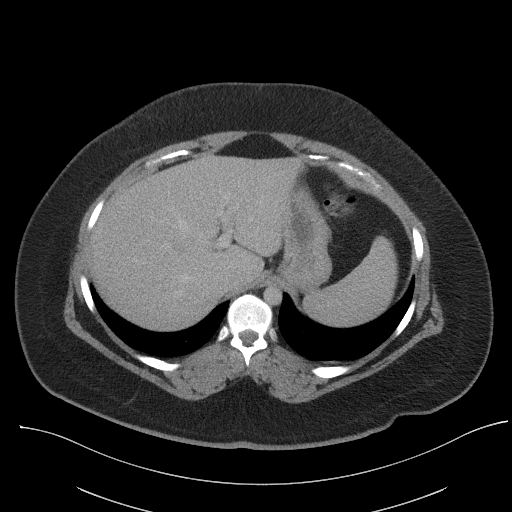
[im 84/97  soft-tissue]
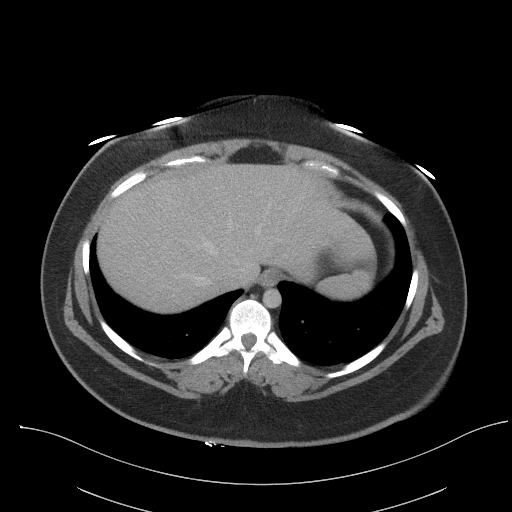
[im 92/97  soft-tissue]
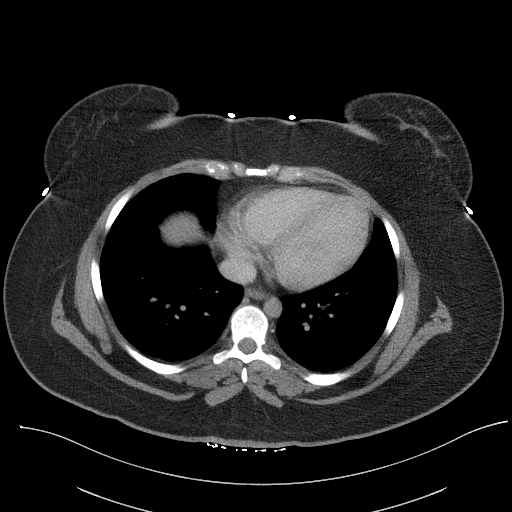

[Series 6: a/p w/ cor · coronal · 0.93mm/px · 3 of 163 slices shown]
[im 55/163  soft-tissue]
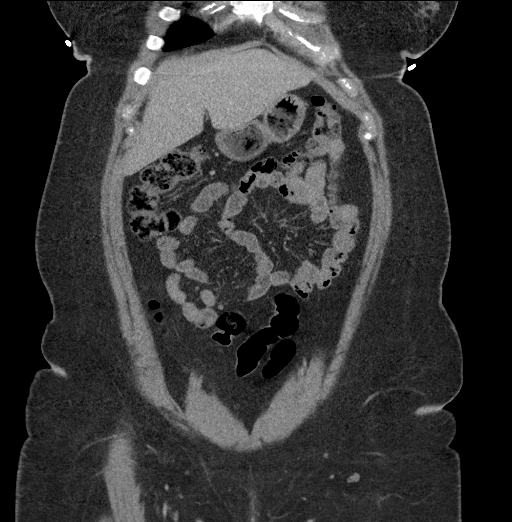
[im 73/163  soft-tissue]
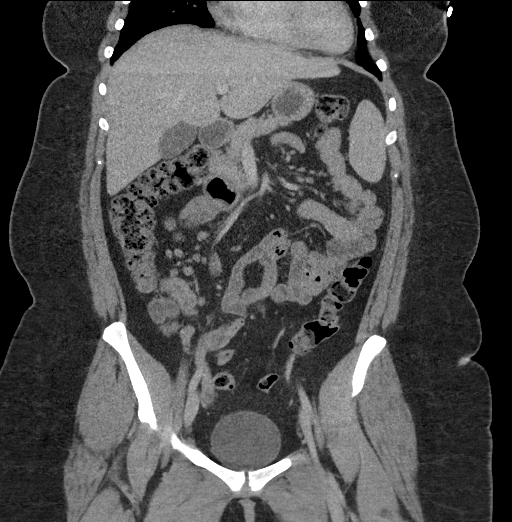
[im 91/163  soft-tissue]
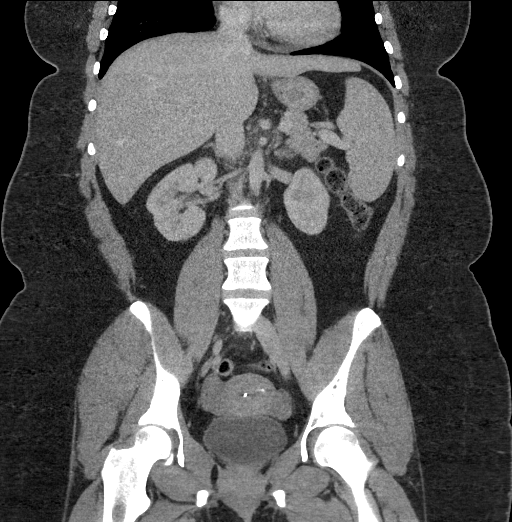

[17 of 46 positions shown; findings below may reference images not displayed]

FINDINGS: Lower chest: No acute abnormality.

Hepatobiliary: No focal liver abnormality is seen. No gallstones,
gallbladder wall thickening, or biliary dilatation.

Pancreas: Unremarkable

Spleen: Unremarkable

Adrenals/Urinary Tract: Adrenal glands are unremarkable. Kidneys are
normal, without renal calculi, focal lesion, or hydronephrosis.
Bladder is unremarkable.

Stomach/Bowel: The stomach, small bowel, and large bowel are
unremarkable. No evidence of obstruction or focal inflammation. The
appendix is normal. There is shotty mesenteric adenopathy within the
ileocecal lymph node group, possibly reactive in nature. No frankly
pathologic mesenteric adenopathy identified. No free intraperitoneal
gas or fluid.

Vascular/Lymphatic: No pathologic adenopathy within the abdomen and
pelvis. The abdominal vasculature is unremarkable.

Reproductive: Intrauterine device in expected position. The pelvic
organs are otherwise unremarkable.

Other: Tiny fat containing umbilical hernia.

Musculoskeletal: No acute bone abnormality.
IMPRESSION: Shotty adenopathy within the ileocecal lymph node group within the
right lower quadrant of the abdomen, possibly reactive in nature. No
frankly pathologic adenopathy. Normal appendix.

## 2023-10-31 NOTE — Procedures (Signed)
 Interventional Radiology Procedure Note  Procedure: lumbar puncture  Pre-Procedure Diagnosis: optic disc edema  Post-Procedure Diagnosis:  optic disc edema  Anesthesia: 1% lidocaine   EBL: Minimal  Contrast: none  Complications: None  Specimens: None  Plan: Dc to home after post procedure nursing care

## 2023-11-02 ENCOUNTER — Other Ambulatory Visit: Payer: Self-pay

## 2023-11-02 ENCOUNTER — Encounter (HOSPITAL_BASED_OUTPATIENT_CLINIC_OR_DEPARTMENT_OTHER): Payer: Self-pay

## 2023-11-02 ENCOUNTER — Emergency Department (HOSPITAL_COMMUNITY): Admission: EM | Admit: 2023-11-02 | Discharge: 2023-11-02 | Discharge status: Against Medical Advice | Payer: Self-pay

## 2023-11-02 ENCOUNTER — Emergency Department (HOSPITAL_BASED_OUTPATIENT_CLINIC_OR_DEPARTMENT_OTHER): Payer: Self-pay

## 2023-11-02 ENCOUNTER — Encounter (HOSPITAL_COMMUNITY): Payer: Self-pay

## 2023-11-02 ENCOUNTER — Emergency Department (HOSPITAL_BASED_OUTPATIENT_CLINIC_OR_DEPARTMENT_OTHER)
Admission: EM | Admit: 2023-11-02 | Discharge: 2023-11-02 | Disposition: A | Discharge status: Home / Self Care | Payer: Self-pay | Attending: Emergency Medicine | Admitting: Emergency Medicine

## 2023-11-02 DIAGNOSIS — G971 Other reaction to spinal and lumbar puncture: Secondary | ICD-10-CM | POA: Insufficient documentation

## 2023-11-02 DIAGNOSIS — R519 Headache, unspecified: Secondary | ICD-10-CM | POA: Insufficient documentation

## 2023-11-02 DIAGNOSIS — R110 Nausea: Secondary | ICD-10-CM | POA: Insufficient documentation

## 2023-11-02 DIAGNOSIS — J45909 Unspecified asthma, uncomplicated: Secondary | ICD-10-CM | POA: Insufficient documentation

## 2023-11-02 DIAGNOSIS — M545 Low back pain, unspecified: Secondary | ICD-10-CM | POA: Insufficient documentation

## 2023-11-02 DIAGNOSIS — Z5321 Procedure and treatment not carried out due to patient leaving prior to being seen by health care provider: Secondary | ICD-10-CM | POA: Insufficient documentation

## 2023-11-02 LAB — CBC WITH DIFFERENTIAL/PLATELET
Abs Immature Granulocytes: 0.02 K/uL (ref 0.00–0.07)
Basophils Absolute: 0 K/uL (ref 0.0–0.1)
Basophils Relative: 0 %
Eosinophils Absolute: 0.2 K/uL (ref 0.0–0.5)
Eosinophils Relative: 3 %
HCT: 38.5 % (ref 36.0–46.0)
Hemoglobin: 13 g/dL (ref 12.0–15.0)
Immature Granulocytes: 0 %
Lymphocytes Relative: 31 %
Lymphs Abs: 2.4 K/uL (ref 0.7–4.0)
MCH: 29.3 pg (ref 26.0–34.0)
MCHC: 33.8 g/dL (ref 30.0–36.0)
MCV: 86.7 fL (ref 80.0–100.0)
Monocytes Absolute: 0.8 K/uL (ref 0.1–1.0)
Monocytes Relative: 10 %
Neutro Abs: 4.3 K/uL (ref 1.7–7.7)
Neutrophils Relative %: 56 %
Platelets: 255 K/uL (ref 150–400)
RBC: 4.44 MIL/uL (ref 3.87–5.11)
RDW: 12.4 % (ref 11.5–15.5)
WBC: 7.7 K/uL (ref 4.0–10.5)
nRBC: 0 % (ref 0.0–0.2)

## 2023-11-02 LAB — COMPREHENSIVE METABOLIC PANEL WITH GFR
ALT: 19 U/L (ref 0–44)
AST: 15 U/L (ref 15–41)
Albumin: 3.8 g/dL (ref 3.5–5.0)
Alkaline Phosphatase: 78 U/L (ref 38–126)
Anion gap: 9 (ref 5–15)
BUN: 11 mg/dL (ref 6–20)
CO2: 23 mmol/L (ref 22–32)
Calcium: 9.1 mg/dL (ref 8.9–10.3)
Chloride: 105 mmol/L (ref 98–111)
Creatinine, Ser: 0.79 mg/dL (ref 0.44–1.00)
GFR, Estimated: >60 mL/min (ref >60)
Glucose, Bld: 100 mg/dL — ABNORMAL HIGH (ref 70–99)
Potassium: 4 mmol/L (ref 3.5–5.1)
Sodium: 137 mmol/L (ref 135–145)
Total Bilirubin: 0.5 mg/dL (ref 0.0–1.2)
Total Protein: 6.5 g/dL (ref 6.5–8.1)

## 2023-11-02 LAB — HCG, SERUM, QUALITATIVE: Preg, Serum: NEGATIVE

## 2023-11-02 MED ORDER — DIPHENHYDRAMINE HCL 25 MG PO CAPS
25.0000 mg | ORAL_CAPSULE | Freq: Once | ORAL | Status: AC
  Administered 2023-11-02: 25 mg via ORAL
  Filled 2023-11-02: qty 1

## 2023-11-02 MED ORDER — KETOROLAC TROMETHAMINE 30 MG/ML IJ SOLN
30.0000 mg | Freq: Once | INTRAMUSCULAR | Status: AC
  Administered 2023-11-02: 30 mg via INTRAVENOUS
  Filled 2023-11-02: qty 1

## 2023-11-02 MED ORDER — SODIUM CHLORIDE 0.9 % IV BOLUS
1000.0000 mL | Freq: Once | INTRAVENOUS | Status: AC
  Administered 2023-11-02: 1000 mL via INTRAVENOUS

## 2023-11-02 MED ORDER — CAFFEINE 200 MG PO TABS
200.0000 mg | ORAL_TABLET | Freq: Once | ORAL | Status: DC
  Filled 2023-11-02: qty 1

## 2023-11-02 MED ORDER — METOCLOPRAMIDE HCL 5 MG/ML IJ SOLN
10.0000 mg | Freq: Once | INTRAMUSCULAR | Status: AC
  Administered 2023-11-02: 10 mg via INTRAVENOUS
  Filled 2023-11-02: qty 2

## 2023-11-02 MED ORDER — DIPHENHYDRAMINE HCL 50 MG/ML IJ SOLN
12.5000 mg | Freq: Once | INTRAMUSCULAR | Status: AC
  Administered 2023-11-02: 12.5 mg via INTRAVENOUS
  Filled 2023-11-02: qty 1

## 2023-11-02 MED ORDER — PROCHLORPERAZINE MALEATE 5 MG PO TABS
10.0000 mg | ORAL_TABLET | Freq: Once | ORAL | Status: AC
  Administered 2023-11-02: 10 mg via ORAL
  Filled 2023-11-02: qty 2

## 2023-11-02 MED ORDER — ASPIRIN-ACETAMINOPHEN-CAFFEINE 250-250-65 MG PO TABS
1.0000 | ORAL_TABLET | Freq: Once | ORAL | Status: AC
  Administered 2023-11-02: 1 via ORAL
  Filled 2023-11-02: qty 1

## 2023-11-02 NOTE — ED Notes (Signed)
 Pt. Stated that she wanted to leave the emergency room to receive care from another facility.

## 2023-11-02 NOTE — ED Notes (Signed)
 IV infiltrated. Fluids stopped and IV removed. Hot pack applied.  PA aware.

## 2023-11-02 NOTE — Discharge Instructions (Addendum)
 Thank you for letting us  evaluate you today.  Your lab work was unremarkable.  Your CT imaging did not show any bleeding.  We have given you medication for pain as well as hydration.  You may continue taking Excedrin , ibuprofen , caffeine , hydrate at home.  If you continue to feel headache, please reach out to your neurologist or interventional radiologist to perform lumbar puncture for possible blood patch however the symptoms typically get better with caffeine , fluids  Return to Emergency Department for experience slurred speech, altered mentation, numbness or weakness in one-sided body, inability ambulate, worsening symptoms, loss of vision, loss of consciousness

## 2023-11-02 NOTE — ED Triage Notes (Signed)
 Reports having spinal tap on Monday. Reports headaches, nausea, dizziness and nosebleeds since.   Denies weakness or visual changes.   LWBS from [facility] ED, no relief from headache cocktail

## 2023-11-02 NOTE — ED Triage Notes (Signed)
 Pt states she got a spinal tap on Monday and now is having nausea, headaches, and lower back pain.  Hx of IIH. Denies any leakage from site of spinal tap. Denies blurred vision, numbness, and tingling.

## 2023-11-02 NOTE — ED Provider Triage Note (Signed)
 Emergency Medicine Provider Triage Evaluation Note  [name] , a 23 y.o. female  was evaluated in triage.  Pt complains of headache.  Patient had a spinal tap on Monday for diagnostic purposes and evaluating her for IIH, has been complaining of headache since then, has used Tylenol /ibuprofen  with minimal relief of symptoms.  Patient states that pain is worse when she is upright or when she first gets into bed, then seems to alleviate but always feels throbbing and pressure behind her eyes.  No fever, vision changes, numbness/tingling/weakness.  Review of Systems  Positive: As above Negative: As above  Physical Exam  BP (!) 146/98 (BP Location: Right Arm)   Pulse 87   Temp 98.7 F (37.1 C) (Oral)   Resp 17   Ht 5' 3 (1.6 m)   Wt 117.9 kg   SpO2 100%   BMI 46.06 kg/m  Gen:   Awake, no distress   Resp:  Normal effort  MSK:   Moves extremities without difficulty  Other:  Facial expressions are symmetric and intact without evidence of facial droop.  Back with minimal bruising over site of LP, no surrounding warmth/erythema.  Medical Decision Making  Medically screening exam initiated at 12:38 PM.  Appropriate orders placed.  [name] was informed that the remainder of the evaluation will be completed by another provider, this initial triage assessment does not replace that evaluation, and the importance of remaining in the ED until their evaluation is complete.     Glendia Rocky SAILOR, [place] 11/02/23 1239

## 2023-11-02 NOTE — ED Provider Notes (Cosign Needed Addendum)
 [place] [facility] Provider Note   CSN: [id number] Arrival date & time: 11/02/23  1655     Patient presents with: Headache   Dalal Destiney Troop is a 23 y.o. female with a past medical history of asthma, IIH presents emergency department for evaluation of headache since having LP for suspected IIH on 10/31/23.  Reports that headache is in front of head with associated nausea.  Has been worsening over past 2 days.  Associated nausea, light sensitivity, neck tightness.  Headache worsens with moving head around, getting up from supine or seated position and feels better with laying down, remaining still. Contacted neurologist at [facility] recommended ED evaluation for CSF leak.  Was initially evaluated by triage provider at [facility] ED who provided a migraine cocktail with absolutely no relief to headache. Left prior to being seen by EDP.   Denies visual disturbances, numbness or weakness in extremities    Headache      Prior to Admission medications   Medication Sig Start Date End Date Taking? Authorizing Provider  acetaminophen  (TYLENOL ) 500 MG tablet Take 500 mg by mouth every 6 (six) hours as needed for mild pain or headache.    [provider]  albuterol  (PROVENTIL  HFA;VENTOLIN  HFA) 108 (90 Base) MCG/ACT inhaler Inhale 1-2 puffs into the lungs every 4 (four) hours as needed for wheezing or shortness of breath.     [provider]  levonorgestrel (MIRENA, 52 MG,) 20 MCG/DAY IUD Mirena 20 mcg/24 hours (7 yrs) 52 mg intrauterine device  Take 1 device by intrauterine route.  pt tolerated well    [provider]  naproxen  (NAPROSYN ) 500 MG tablet Take 1 tablet (500 mg total) by mouth every 12 (twelve) hours as needed for moderate pain or mild pain (Take with food). 08/19/20   Keith Sor, PA-C  ondansetron  (ZOFRAN  ODT) 4 MG disintegrating tablet Take 1 tablet (4 mg total) by mouth every 8 (eight) hours as needed for nausea or  vomiting. 08/19/20   Keith Sor, PA-C    Allergies: Amoxicillin and Penicillins    Review of Systems  Neurological:  Positive for headaches.    Updated Vital Signs BP (!) 145/92 (BP Location: Left Arm)   Pulse 73   Temp 98.2 F (36.8 C) (Oral)   Resp 20   SpO2 100%   Physical Exam Vitals and nursing note reviewed.  Constitutional:      General: She is not in acute distress.    Appearance: Normal appearance.  HENT:     Head: Normocephalic and atraumatic.  Eyes:     General: Lids are normal. Vision grossly intact. No visual field deficit.    Extraocular Movements:     Right eye: Normal extraocular motion and no nystagmus.     Left eye: Normal extraocular motion and no nystagmus.     Conjunctiva/sclera: Conjunctivae normal.  Neck:     Comments: No meningismus Cardiovascular:     Rate and Rhythm: Normal rate.  Pulmonary:     Effort: Pulmonary effort is normal. No respiratory distress.     Breath sounds: Normal breath sounds.  Musculoskeletal:     Cervical back: Normal range of motion and neck supple. No rigidity. Normal range of motion.  Skin:    Coloration: Skin is not jaundiced or pale.     Comments: Small amount of ecchymosis to lumbar puncture site.  No surrounding erythema, streaking, drainage, nor warmth  Neurological:     Mental Status: She is alert  and oriented to person, place, and time. Mental status is at baseline.     GCS: GCS eye subscore is 4. GCS verbal subscore is 5. GCS motor subscore is 6.     Cranial Nerves: No cranial nerve deficit, dysarthria or facial asymmetry.     Sensory: Sensation is intact. No sensory deficit.     Motor: No weakness, tremor, atrophy, abnormal muscle tone, seizure activity or pronator drift.     Coordination: Coordination normal. Finger-Nose-Finger Test and Heel to [place] Test normal.     Gait: Gait is intact. Gait normal.     Comments: Motor 5/5 and sensation 2/2 BUE and BLE.  No slurred speech nor aphasia     (all labs  ordered are listed, but only abnormal results are displayed) Labs Reviewed - No data to display  EKG: None  Radiology: CT Head Wo Contrast Result Date: 11/02/2023 CLINICAL DATA:  Headache EXAM: CT HEAD WITHOUT CONTRAST TECHNIQUE: Contiguous axial images were obtained from the base of the skull through the vertex without intravenous contrast. RADIATION DOSE REDUCTION: This exam was performed according to the departmental dose-optimization program which includes automated exposure control, adjustment of the mA and/or kV according to patient size and/or use of iterative reconstruction technique. COMPARISON:  None Available. FINDINGS: Brain: No acute intracranial abnormality. Specifically, no hemorrhage, hydrocephalus, mass lesion, acute infarction, or significant intracranial injury. Vascular: No hyperdense vessel or unexpected calcification. Skull: No acute calvarial abnormality. Sinuses/Orbits: No acute findings Other: None IMPRESSION: No acute intracranial abnormality. Electronically Signed   By: Franky Crease M.D.   On: 11/02/2023 20:56     Medications Ordered in the ED  sodium chloride  0.9 % bolus 1,000 mL (0 mLs Intravenous Stopped 11/02/23 2127)  aspirin -acetaminophen -caffeine  (EXCEDRIN  MIGRAINE) per tablet 1 tablet (1 tablet Oral Given 11/02/23 1929)  ketorolac  (TORADOL ) 30 MG/ML injection 30 mg (30 mg Intravenous Given 11/02/23 2129)  metoCLOPramide  (REGLAN ) injection 10 mg (10 mg Intravenous Given 11/02/23 2130)  diphenhydrAMINE  (BENADRYL ) injection 12.5 mg (12.5 mg Intravenous Given 11/02/23 2129)  sodium chloride  0.9 % bolus 1,000 mL (0 mLs Intravenous Stopped 11/02/23 2202)                                    Medical Decision Making Amount and/or Complexity of Data Reviewed Radiology: ordered.  Risk OTC drugs. Prescription drug management.   Patient presents to the ED for concern of headache, this involves an extensive number of treatment options, and is a complaint that carries  with it a high risk of complications and morbidity.  The differential diagnosis includes CSF leak, migraine, ICH, SAH, electrolyte abnormality, pregnancy   Co morbidities that complicate the patient evaluation  S/P LP 2 days ago   Additional history obtained:  Additional history obtained from Nursing and Outside Medical Records   External records from outside source obtained and reviewed including triage RN note, triage EDP note from [facility] ED   Lab Tests:  I Ordered, and personally interpreted labs.  The pertinent results include:   hCG negative CBG 100   Imaging Studies ordered:  I ordered imaging studies including CT head without contrast I independently visualized and interpreted imaging which showed no ICH or SAH I agree with the radiologist interpretation    Medicines ordered and prescription drug management:  I ordered medication including Excedrin , IVF, Toradol , Benadryl , Reglan  for headache Reevaluation of the patient after these medicines showed that the patient  improved I have reviewed the patients home medicines and have made adjustments as needed    Problem List / ED Course:  HA S/P lumbar puncture Obtained at [facility] ED: CBC, CMP WNL.  hCG negative CT head without ICH nor SAH No infectious signs to lumbar puncture site. No tenderness, mass, nor fluctuance. Low suspicion for epidural abscess Was provided Compazine , Benadryl  at [facility] ED earlier without resolution.  Provided Reglan , Benadryl , Toradol , IVF, Excedrin  for pain relief.  Patient reports that pain has significantly decreased and pain resolves quicker following medication, IVF. Continues to remain neurologically intact throughout ED stay Discussed symptomatic treatment at home to include continued hydration, caffeine , rest and that symptoms are likely to resolve with symptomatic care however she can reach out to her neurologist, IR for blood patch if symptoms worsen Discussed strict return precautions with  patient and provided them on DC paperwork   Reevaluation:  After the interventions noted above, I reevaluated the patient and found that they have :improved    Dispostion:  After consideration of the diagnostic results and the patients response to treatment, I feel that the patent would benefit from outpatient management symptomatic care.   Discussed ED workup, disposition, return to ED precautions with patient who expresses understanding agrees with plan.  All questions answered to their satisfaction.  They are agreeable to plan.  Discharge instructions provided on paperwork  Discussed patient with Dr. Lenor who reviewed ED workup and agrees with plan  Final diagnoses:  Headache, post-lumbar puncture    ED Discharge Orders     None        Minnie Tinnie BRAVO, PA 11/02/23 2340    Minnie Tinnie BRAVO, PA 11/02/23 [id number]    Lenor Hollering, MD 11/03/23 1353

## 2023-11-21 ENCOUNTER — Ambulatory Visit (INDEPENDENT_AMBULATORY_CARE_PROVIDER_SITE_OTHER): Admit: 2023-11-21 | Discharge: 2023-11-21 | Disposition: A | Discharge status: Home / Self Care | Admitting: Radiology

## 2023-11-21 ENCOUNTER — Encounter (HOSPITAL_BASED_OUTPATIENT_CLINIC_OR_DEPARTMENT_OTHER): Payer: Self-pay | Admitting: Emergency Medicine

## 2023-11-21 ENCOUNTER — Ambulatory Visit (HOSPITAL_BASED_OUTPATIENT_CLINIC_OR_DEPARTMENT_OTHER)
Admission: EM | Admit: 2023-11-21 | Discharge: 2023-11-21 | Disposition: A | Discharge status: Home / Self Care | Attending: Family Medicine | Admitting: Family Medicine

## 2023-11-21 DIAGNOSIS — R1032 Left lower quadrant pain: Secondary | ICD-10-CM | POA: Insufficient documentation

## 2023-11-21 DIAGNOSIS — M5441 Lumbago with sciatica, right side: Secondary | ICD-10-CM | POA: Insufficient documentation

## 2023-11-21 DIAGNOSIS — R1011 Right upper quadrant pain: Secondary | ICD-10-CM

## 2023-11-21 DIAGNOSIS — K5900 Constipation, unspecified: Secondary | ICD-10-CM | POA: Insufficient documentation

## 2023-11-21 DIAGNOSIS — R1031 Right lower quadrant pain: Secondary | ICD-10-CM | POA: Insufficient documentation

## 2023-11-21 DIAGNOSIS — R102 Pelvic and perineal pain: Secondary | ICD-10-CM

## 2023-11-21 DIAGNOSIS — Z975 Presence of (intrauterine) contraceptive device: Secondary | ICD-10-CM | POA: Diagnosis not present

## 2023-11-21 DIAGNOSIS — R1012 Left upper quadrant pain: Secondary | ICD-10-CM

## 2023-11-21 LAB — POCT URINE DIPSTICK
Bilirubin, UA: NEGATIVE
Blood, UA: NEGATIVE
Glucose, UA: NEGATIVE mg/dL
Ketones, POC UA: NEGATIVE mg/dL
Leukocytes, UA: NEGATIVE
Nitrite, UA: NEGATIVE
Spec Grav, UA: 1.025 (ref 1.010–1.025)
Urobilinogen, UA: 1 U/dL
pH, UA: 7 (ref 5.0–8.0)

## 2023-11-21 MED ORDER — POLYETHYLENE GLYCOL 3350 17 GM/SCOOP PO POWD: ORAL | 0 refills | Status: DC

## 2023-11-21 MED ORDER — CYCLOBENZAPRINE HCL 5 MG PO TABS: 5.0000 mg | ORAL_TABLET | Freq: Every evening | ORAL | 0 refills | Status: AC | PRN

## 2023-11-21 MED ORDER — DICLOFENAC SODIUM 50 MG PO TBEC
50.0000 mg | DELAYED_RELEASE_TABLET | Freq: Two times a day (BID) | ORAL | 0 refills | Status: AC | PRN
Start: 2023-11-21 — End: 2023-12-06

## 2023-11-21 NOTE — ED Triage Notes (Signed)
 Pt c/o lower right abdominal pain, pain is intermittent and feels like throbbing after she eats and when she lays down x couple days

## 2023-11-21 NOTE — ED Provider Notes (Signed)
 [facility]    CSN: [id number] Arrival date & time: 11/21/23  1713      History   Chief Complaint Chief Complaint  Patient presents with   Abdominal Pain    HPI [name] is a 23 y.o. female.   23 year old female with complaint of right lower quadrant abdominal pain that is intermittent and throbbing.  The pain started on approximately 11/18/2023 or earlier.  It persist even when she lays down.  She felt like she might of had some constipation initially (on 11/18/2023) but since that time she has had several good bowel movements and feels like she is emptying no longer has any constipation.  She has had a loss of appetite and some nausea.  She denies fever, vomiting and diarrhea.  She has not had a menstrual cycle in over a year.  She has a Nexplanon insert.     Past Medical History:  Diagnosis Date   Anxiety    Asthma    Depression    Headache    hormone induced   Impetigo     Patient Active Problem List   Diagnosis Date Noted   Postpartum endometritis 04/25/2017   Anemia due to acute blood loss 04/25/2017   Status post primary low transverse cesarean section 04/24/2017   Preeclampsia, severe 04/19/2017    Past Surgical History:  Procedure Laterality Date   CESAREAN SECTION N/A 04/21/2017   Procedure: CESAREAN SECTION;  Surgeon: Timmie Norris, MD;  Location: [facility] BIRTHING SUITES;  Service: Obstetrics;  Laterality: N/A;   TONSILLECTOMY      OB History     Gravida  1   Para  1   Term  1   Preterm  0   AB  0   Living  1      SAB  0   IAB  0   Ectopic  0   Multiple  0   Live Births  1            Home Medications    Prior to Admission medications   Medication Sig Start Date End Date Taking? Authorizing Provider  cyclobenzaprine (FLEXERIL) 5 MG tablet Take 1 tablet (5 mg total) by mouth at bedtime as needed for up to 15 days for muscle spasms. 11/21/23 12/06/23 Yes Ival Domino, FNP  diclofenac (VOLTAREN) 50 MG EC  tablet Take 1 tablet (50 mg total) by mouth 2 (two) times daily as needed for up to 15 days (take with food). 11/21/23 12/06/23 Yes Ival Domino, FNP  etonogestrel (NEXPLANON) 68 MG IMPL implant once   Yes [provider]  polyethylene glycol powder (GLYCOLAX/MIRALAX) 17 GM/SCOOP powder Mix 1 capful of MiraLAX powder in 8 ounces of clear liquids and take once or twice daily as needed to get empty of stool.  Once empty of stool could take half to 1 capful of MiraLAX powder in 8 ounces of clear liquids daily to keep bowels more regular. 11/21/23  Yes Ival Domino, FNP  acetaminophen  (TYLENOL ) 500 MG tablet Take 500 mg by mouth every 6 (six) hours as needed for mild pain or headache.    [provider]  albuterol  (PROVENTIL  HFA;VENTOLIN  HFA) 108 (90 Base) MCG/ACT inhaler Inhale 1-2 puffs into the lungs every 4 (four) hours as needed for wheezing or shortness of breath.     [provider]  levonorgestrel (MIRENA, 52 MG,) 20 MCG/DAY IUD Mirena 20 mcg/24 hours (7 yrs) 52 mg intrauterine device  Take 1 device by intrauterine  route.  pt tolerated well    [provider]    Family History Family History  Problem Relation Age of Onset   Hypertension Maternal Grandmother    Arthritis Maternal Grandmother    Depression Maternal Grandmother    Heart disease Maternal Grandfather    Hypertension Maternal Grandfather    Anemia Mother     Social History Social History   Tobacco Use   Smoking status: Never   Smokeless tobacco: Never  Vaping Use   Vaping status: Every Day  Substance Use Topics   Alcohol use: No   Drug use: No     Allergies   Amoxicillin and Penicillins   Review of Systems Review of Systems  Constitutional:  Positive for appetite change. Negative for chills and fever.  HENT:  Negative for ear pain and sore throat.   Eyes:  Negative for pain and visual disturbance.  Respiratory:  Negative for cough and shortness of breath.    Cardiovascular:  Negative for chest pain and palpitations.  Gastrointestinal:  Positive for abdominal pain and nausea. Negative for constipation, diarrhea and vomiting.  Genitourinary:  Negative for dysuria and hematuria.  Musculoskeletal:  Negative for arthralgias and back pain.  Skin:  Negative for color change and rash.  Neurological:  Negative for seizures and syncope.  All other systems reviewed and are negative.    Physical Exam Triage Vital Signs ED Triage Vitals  Encounter Vitals Group     BP 11/21/23 1723 121/80     Girls Systolic BP Percentile --      Girls Diastolic BP Percentile --      Boys Systolic BP Percentile --      Boys Diastolic BP Percentile --      Pulse Rate 11/21/23 1723 84     Resp 11/21/23 1723 18     Temp 11/21/23 1723 97.9 F (36.6 C)     Temp Source 11/21/23 1723 Oral     SpO2 11/21/23 1723 98 %     Weight --      Height --      Head Circumference --      Peak Flow --      Pain Score 11/21/23 1722 4     Pain Loc --      Pain Education --      Exclude from Growth Chart --    No data found.  Updated Vital Signs BP 121/80 (BP Location: Right Arm)   Pulse 84   Temp 97.9 F (36.6 C) (Oral)   Resp 18   SpO2 98%   Visual Acuity Right Eye Distance:   Left Eye Distance:   Bilateral Distance:    Right Eye Near:   Left Eye Near:    Bilateral Near:     Physical Exam Vitals and nursing note reviewed.  Constitutional:      General: She is not in acute distress.    Appearance: She is well-developed. She is not ill-appearing, toxic-appearing or diaphoretic.  HENT:     Head: Normocephalic and atraumatic.     Right Ear: Hearing, tympanic membrane, ear canal and external ear normal.     Left Ear: Hearing, tympanic membrane, ear canal and external ear normal.     Nose: No congestion or rhinorrhea.     Right Sinus: No maxillary sinus tenderness or frontal sinus tenderness.     Left Sinus: No maxillary sinus tenderness or frontal sinus  tenderness.     Mouth/Throat:     Lips:  Pink.     Mouth: Mucous membranes are moist.     Pharynx: Uvula midline. No oropharyngeal exudate or posterior oropharyngeal erythema.     Tonsils: No tonsillar exudate.  Eyes:     Conjunctiva/sclera: Conjunctivae normal.     Pupils: Pupils are equal, round, and reactive to light.  Cardiovascular:     Rate and Rhythm: Normal rate and regular rhythm.     Heart sounds: S1 normal and S2 normal. No murmur heard. Pulmonary:     Effort: Pulmonary effort is normal. No respiratory distress.     Breath sounds: Normal breath sounds. No decreased breath sounds, wheezing, rhonchi or rales.  Abdominal:     General: Bowel sounds are normal.     Palpations: Abdomen is soft.     Tenderness: There is abdominal tenderness (Abdominal pain is diffuse and mild to moderate with the worst pain in the right lower quadrant.) in the right upper quadrant, right lower quadrant, suprapubic area, left upper quadrant and left lower quadrant. There is no right CVA tenderness, left CVA tenderness, guarding or rebound. Negative signs include Murphy's sign, Rovsing's sign and McBurney's sign.  Musculoskeletal:        General: No swelling.     Cervical back: Normal and neck supple.     Thoracic back: Normal.     Lumbar back: Tenderness and bony tenderness present. No swelling, edema, deformity, signs of trauma, lacerations or spasms. Normal range of motion. No scoliosis.  Lymphadenopathy:     Head:     Right side of head: No submental, submandibular, tonsillar, preauricular or posterior auricular adenopathy.     Left side of head: No submental, submandibular, tonsillar, preauricular or posterior auricular adenopathy.     Cervical: No cervical adenopathy.     Right cervical: No superficial cervical adenopathy.    Left cervical: No superficial cervical adenopathy.  Skin:    General: Skin is warm and dry.     Capillary Refill: Capillary refill takes less than 2 seconds.      Findings: No rash.  Neurological:     Mental Status: She is alert and oriented to person, place, and time.  Psychiatric:        Mood and Affect: Mood normal.      UC Treatments / Results  Labs (all labs ordered are listed, but only abnormal results are displayed) Labs Reviewed  POCT URINE DIPSTICK - Abnormal; Notable for the following components:      Result Value   Clarity, UA hazy (*)    Protein Ur, POC trace (*)    All other components within normal limits  URINE CULTURE   Comprehensive metabolic panel: 11/02/23:     Component Ref Range & Units (hover) 2 wk ago (11/02/23)  Sodium 137  Potassium 4.0  Chloride 105  CO2 23  Glucose, Bld 100 High   Comment: Glucose reference range applies only to samples taken after fasting for at least 8 hours.  BUN 11  Creatinine, Ser 0.79  Calcium 9.1  Total Protein 6.5  Albumin 3.8  AST 15  ALT 19  Alkaline Phosphatase 78  Total Bilirubin 0.5  GFR, Estimated >60  Comment: (NOTE) Calculated using the CKD-EPI Creatinine Equation (2021)  Anion gap 9  Comment: Performed at [facility], [address] [place], [place] [address]  Resulting Agency [facility]      EKG   Radiology DG Abd 1 View Result Date: 11/21/2023 CLINICAL DATA:  Abdominal pain. EXAM: ABDOMEN -  1 VIEW COMPARISON:  CT 08/19/2020 FINDINGS: No bowel dilatation or evidence of obstruction. Small to moderate volume of stool in the colon. No abnormal rectal distention. No radiopaque calculi. IUD in the pelvis on prior CT is not seen. No acute osseous findings. IMPRESSION: Normal bowel gas pattern. Small to moderate volume of colonic stool. Electronically Signed   By: Andrea Gasman M.D.   On: 11/21/2023 18:02    Procedures Procedures (including critical care time)  Medications Ordered in UC Medications - No data to display  Initial Impression / Assessment and Plan / UC Course  I have reviewed the triage vital signs and the nursing notes.  Pertinent  labs & imaging results that were available during my care of the patient were reviewed by me and considered in my medical decision making (see chart for details).  Plan of Care: Abdominal pain: Urinalysis was essentially normal but was slightly hazy.  Urine culture sent.  Will adjust the plan of care, if needed once the culture results.  Abdominal x-ray shows a moderate volume of stool in the colon and some gas but is otherwise normal or negative.  Constipation: Based on the abdominal x-ray, the patient still needs to get empty of stool.  Encouraged MiraLAX, 1 capful in 8 ounces of cool liquid, 1 1-3 times daily for 2 or 3 days until she gets completely empty of stool.  Lower back pain: Use cyclobenzaprine, 5 mg, 1 pill nightly for muscle spasms or back pain: Do not use cyclobenzaprine and drive.  Diclofenac, 50 mg, 1 pill twice daily take with food as needed for back pain.  May use cyclobenzaprine and diclofenac together.  Encouraged use of ice therapy (ice packs 30 minutes on and 30 minutes off and repeat as often as needed) for 24 to 48 hours.  Then switch to heat therapy (heat packs 30 minutes on and 30 minutes off and repeat as often as needed).   Follow-up if symptoms do not improve, worsen or new symptoms occur.  See discharge instructions for cautions and reasons to return here or go to an emergency room.  I reviewed the plan of care with the patient and/or the patient's guardian.  The patient and/or guardian had time to ask questions and acknowledged that the questions were answered.  I provided instruction on symptoms or reasons to return here or to go to an ER, if symptoms/condition did not improve, worsened or if new symptoms occurred.  Final Clinical Impressions(s) / UC Diagnoses   Final diagnoses:  Right upper quadrant abdominal pain  Right lower quadrant abdominal pain  Left lower quadrant abdominal pain  Left upper quadrant abdominal pain  Suprapubic abdominal pain  Acute midline  low back pain with right-sided sciatica  Constipation, unspecified constipation type     Discharge Instructions      Abdominal pain: Urinalysis was essentially normal but was slightly hazy.  Urine culture sent.  Will adjust the plan of care, if needed once the culture results.  Abdominal x-ray shows a moderate volume of stool in the colon and some gas but is otherwise normal or negative.  Constipation: Based on the abdominal x-ray, the patient still needs to get empty of stool.  Encouraged MiraLAX, 1 capful in 8 ounces of cool liquid, 1 1-3 times daily for 2 or 3 days until she gets completely empty of stool.  Lower back pain: Use cyclobenzaprine, 5 mg, 1 pill nightly for muscle spasms or back pain: Do not use cyclobenzaprine and drive.  Diclofenac, 50 mg, 1 pill twice daily take with food as needed for back pain.  May use cyclobenzaprine and diclofenac together.  Encouraged use of ice therapy (ice packs 30 minutes on and 30 minutes off and repeat as often as needed) for 24 to 48 hours.  Then switch to heat therapy (heat packs 30 minutes on and 30 minutes off and repeat as often as needed).   Follow-up if symptoms do not improve, worsen or new symptoms occur.  See below for cautions and reasons to return here or go to an emergency room.  Contact a health care provider if: Your pain changes, gets worse, or lasts longer than expected. You have severe cramping or bloating in your abdomen, or you vomit. Your pain gets worse with meals, after eating, or with certain foods. You are constipated or have diarrhea for more than 2-3 days. You are not hungry, or you lose weight without trying. You have signs of dehydration. These may include: Dark pee, very little pee, or no pee. Cracked lips or dry mouth. Sleepiness or weakness. You have pain when you pee (urinate) or poop. Your abdominal pain wakes you up at night. You have blood in your pee. You have a fever. Get help right away if: You cannot  stop vomiting. Your pain is only in one part of the abdomen. Pain on the right side could be caused by appendicitis. You have bloody or black poop (stool), or poop that looks like tar. You have trouble breathing. You have chest pain. These symptoms may be an emergency. Get help right away. Call 911. Do not wait to see if the symptoms will go away. Do not drive yourself to the hospital.       ED Prescriptions     Medication Sig Dispense Auth. Provider   polyethylene glycol powder (GLYCOLAX/MIRALAX) 17 GM/SCOOP powder Mix 1 capful of MiraLAX powder in 8 ounces of clear liquids and take once or twice daily as needed to get empty of stool.  Once empty of stool could take half to 1 capful of MiraLAX powder in 8 ounces of clear liquids daily to keep bowels more regular. 510 g Ival Domino, FNP   cyclobenzaprine (FLEXERIL) 5 MG tablet Take 1 tablet (5 mg total) by mouth at bedtime as needed for up to 15 days for muscle spasms. 15 tablet Gennifer Potenza, FNP   diclofenac (VOLTAREN) 50 MG EC tablet Take 1 tablet (50 mg total) by mouth 2 (two) times daily as needed for up to 15 days (take with food). 30 tablet Marquette Blodgett, FNP      PDMP not reviewed this encounter.   Ival Domino, FNP 11/21/23 [id number]

## 2023-11-21 NOTE — Discharge Instructions (Addendum)
 Abdominal pain: Urinalysis was essentially normal but was slightly hazy.  Urine culture sent.  Will adjust the plan of care, if needed once the culture results.  Abdominal x-ray shows a moderate volume of stool in the colon and some gas but is otherwise normal or negative.  Constipation: Based on the abdominal x-ray, the patient still needs to get empty of stool.  Encouraged MiraLAX, 1 capful in 8 ounces of cool liquid, 1 1-3 times daily for 2 or 3 days until she gets completely empty of stool.  Lower back pain: Use cyclobenzaprine, 5 mg, 1 pill nightly for muscle spasms or back pain: Do not use cyclobenzaprine and drive.  Diclofenac, 50 mg, 1 pill twice daily take with food as needed for back pain.  May use cyclobenzaprine and diclofenac together.  Encouraged use of ice therapy (ice packs 30 minutes on and 30 minutes off and repeat as often as needed) for 24 to 48 hours.  Then switch to heat therapy (heat packs 30 minutes on and 30 minutes off and repeat as often as needed).   Follow-up if symptoms do not improve, worsen or new symptoms occur.  See below for cautions and reasons to return here or go to an emergency room.  Contact a health care provider if: Your pain changes, gets worse, or lasts longer than expected. You have severe cramping or bloating in your abdomen, or you vomit. Your pain gets worse with meals, after eating, or with certain foods. You are constipated or have diarrhea for more than 2-3 days. You are not hungry, or you lose weight without trying. You have signs of dehydration. These may include: Dark pee, very little pee, or no pee. Cracked lips or dry mouth. Sleepiness or weakness. You have pain when you pee (urinate) or poop. Your abdominal pain wakes you up at night. You have blood in your pee. You have a fever. Get help right away if: You cannot stop vomiting. Your pain is only in one part of the abdomen. Pain on the right side could be caused by appendicitis. You  have bloody or black poop (stool), or poop that looks like tar. You have trouble breathing. You have chest pain. These symptoms may be an emergency. Get help right away. Call 911. Do not wait to see if the symptoms will go away. Do not drive yourself to the hospital.

## 2023-11-22 LAB — URINE CULTURE: Culture: NO GROWTH

## 2023-11-23 ENCOUNTER — Ambulatory Visit (HOSPITAL_BASED_OUTPATIENT_CLINIC_OR_DEPARTMENT_OTHER): Payer: Self-pay | Admitting: Family Medicine

## 2023-11-23 NOTE — Progress Notes (Signed)
 Urine culture is negative.  No UTI.  Patient updated.  She reports she is no worse but does not feel any better either.  She is working on getting empty of stool.  She denies any fever.  She will return to primary care or here or an ER if her symptoms worsen.

## 2023-12-31 ENCOUNTER — Ambulatory Visit (HOSPITAL_BASED_OUTPATIENT_CLINIC_OR_DEPARTMENT_OTHER): Payer: Self-pay | Admitting: Family Medicine

## 2023-12-31 ENCOUNTER — Encounter (HOSPITAL_BASED_OUTPATIENT_CLINIC_OR_DEPARTMENT_OTHER): Payer: Self-pay

## 2023-12-31 ENCOUNTER — Ambulatory Visit (HOSPITAL_BASED_OUTPATIENT_CLINIC_OR_DEPARTMENT_OTHER)
Admission: EM | Admit: 2023-12-31 | Discharge: 2023-12-31 | Disposition: A | Discharge status: Home / Self Care | Attending: Family Medicine | Admitting: Family Medicine

## 2023-12-31 ENCOUNTER — Ambulatory Visit (HOSPITAL_BASED_OUTPATIENT_CLINIC_OR_DEPARTMENT_OTHER): Admit: 2023-12-31 | Discharge: 2023-12-31 | Disposition: A | Admitting: Radiology

## 2023-12-31 DIAGNOSIS — M545 Low back pain, unspecified: Secondary | ICD-10-CM

## 2023-12-31 HISTORY — DX: Essential (primary) hypertension: I10

## 2023-12-31 LAB — POCT URINE DIPSTICK
Bilirubin, UA: NEGATIVE
Blood, UA: NEGATIVE
Glucose, UA: NEGATIVE mg/dL
Ketones, POC UA: NEGATIVE mg/dL
Nitrite, UA: NEGATIVE
Protein Ur, POC: NEGATIVE mg/dL
Spec Grav, UA: 1.02 (ref 1.010–1.025)
Urobilinogen, UA: 0.2 U/dL
pH, UA: 7 (ref 5.0–8.0)

## 2023-12-31 MED ORDER — METHOCARBAMOL 500 MG PO TABS: 500.0000 mg | ORAL_TABLET | Freq: Two times a day (BID) | ORAL | 0 refills | Status: DC

## 2023-12-31 MED ORDER — KETOROLAC TROMETHAMINE 30 MG/ML IJ SOLN
30.0000 mg | Freq: Once | INTRAMUSCULAR | Status: AC
Start: 2023-12-31 — End: 2023-12-31
  Administered 2023-12-31: 30 mg via INTRAMUSCULAR

## 2023-12-31 NOTE — ED Provider Notes (Signed)
 [facility]    CSN: [id number] Arrival date & time: 12/31/23  0807      History   Chief Complaint Chief Complaint  Patient presents with   Back Pain    HPI [name] is a 23 y.o. female.   Patient is a 23 year old female presents today with left flank/back pain.  This has been present for the past 2 to 3 days.  The pain is worsening.  There is some swelling in the area.  Denies any injuries.  Denies any urinary symptoms.  Does have a history of kidney stones.  She has been doing heat and ice to the area and ibuprofen  and Tylenol  with minimal relief.  No fever   Back Pain   Past Medical History:  Diagnosis Date   Anxiety    Asthma    Depression    Headache    hormone induced   Hypertension    Impetigo     Patient Active Problem List   Diagnosis Date Noted   Postpartum endometritis 04/25/2017   Anemia due to acute blood loss 04/25/2017   Status post primary low transverse cesarean section 04/24/2017   Preeclampsia, severe 04/19/2017    Past Surgical History:  Procedure Laterality Date   CESAREAN SECTION N/A 04/21/2017   Procedure: CESAREAN SECTION;  Surgeon: Timmie Norris, MD;  Location: [facility] BIRTHING SUIT[facility];  Service: Obstetrics;  Laterality: N/A;   TONSILLECTOMY      OB History     Gravida  1   Para  1   Term  1   Preterm  0   AB  0   Living  1      SAB  0   IAB  0   Ectopic  0   Multiple  0   Live Births  1            Home Medications    Prior to Admission medications   Medication Sig Start Date End Date Taking? Authorizing Provider  acetaminophen  (TYLENOL ) 500 MG tablet Take 500 mg by mouth every 6 (six) hours as needed for mild pain or headache.   Yes [provider]  albuterol  (PROVENTIL  HFA;VENTOLIN  HFA) 108 (90 Base) MCG/ACT inhaler Inhale 1-2 puffs into the lungs every 4 (four) hours as needed for wheezing or shortness of breath.    Yes [provider]  etonogestrel (NEXPLANON)  68 MG IMPL implant once   Yes [provider]  levonorgestrel (MIRENA, 52 MG,) 20 MCG/DAY IUD Mirena 20 mcg/24 hours (7 yrs) 52 mg intrauterine device  Take 1 device by intrauterine route.  pt tolerated well   Yes [provider]  polyethylene glycol powder (GLYCOLAX/MIRALAX) 17 GM/SCOOP powder Mix 1 capful of MiraLAX powder in 8 ounces of clear liquids and take once or twice daily as needed to get empty of stool.  Once empty of stool could take half to 1 capful of MiraLAX powder in 8 ounces of clear liquids daily to keep bowels more regular. 11/21/23  Yes Ival Domino, FNP    Family History Family History  Problem Relation Age of Onset   Hypertension Maternal Grandmother    Arthritis Maternal Grandmother    Depression Maternal Grandmother    Heart disease Maternal Grandfather    Hypertension Maternal Grandfather    Anemia Mother     Social History Social History   Tobacco Use   Smoking status: Never   Smokeless tobacco: Never  Vaping Use   Vaping status: Every Day  Substance Use Topics   Alcohol use: No   Drug use: No     Allergies   Amoxicillin and Penicillins   Review of Systems Review of Systems  Musculoskeletal:  Positive for back pain.     Physical Exam Triage Vital Signs ED Triage Vitals  Encounter Vitals Group     BP 12/31/23 0825 123/86     Girls Systolic BP Percentile --      Girls Diastolic BP Percentile --      Boys Systolic BP Percentile --      Boys Diastolic BP Percentile --      Pulse Rate 12/31/23 0825 82     Resp 12/31/23 0825 17     Temp 12/31/23 0825 98.1 F (36.7 C)     Temp Source 12/31/23 0825 Oral     SpO2 12/31/23 0825 96 %     Weight 12/31/23 0823 263 lb (119.3 kg)     Height 12/31/23 0823 5' 3 (1.6 m)     Head Circumference --      Peak Flow --      Pain Score 12/31/23 0823 8     Pain Loc --      Pain Education --      Exclude from Growth Chart --    No data found.  Updated Vital Signs BP 123/86 (BP  Location: Right Arm)   Pulse 82   Temp 98.1 F (36.7 C) (Oral)   Resp 17   Ht 5' 3 (1.6 m)   Wt 263 lb (119.3 kg)   SpO2 96%   BMI 46.59 kg/m   Visual Acuity Right Eye Distance:   Left Eye Distance:   Bilateral Distance:    Right Eye Near:   Left Eye Near:    Bilateral Near:     Physical Exam Vitals and nursing note reviewed.  Constitutional:      General: She is not in acute distress.    Appearance: Normal appearance. She is not ill-appearing, toxic-appearing or diaphoretic.  Pulmonary:     Effort: Pulmonary effort is normal.  Musculoskeletal:        General: Swelling present.       Arms:  Skin:    General: Skin is warm and dry.  Neurological:     Mental Status: She is alert.  Psychiatric:        Mood and Affect: Mood normal.      UC Treatments / Results  Labs (all labs ordered are listed, but only abnormal results are displayed) Labs Reviewed  POCT URINE DIPSTICK - Abnormal; Notable for the following components:      Result Value   Leukocytes, UA Trace (*)    All other components within normal limits  URINE CULTURE    EKG   Radiology No results found.  Procedures Procedures (including critical care time)  Medications Ordered in UC Medications  ketorolac  (TORADOL ) 30 MG/ML injection 30 mg (30 mg Intramuscular Given 12/31/23 0849)    Initial Impression / Assessment and Plan / UC Course  I have reviewed the triage vital signs and the nursing notes.  Pertinent labs & imaging results that were available during my care of the patient were reviewed by me and considered in my medical decision making (see chart for details).      Final Clinical Impressions(s) / UC Diagnoses   Final diagnoses:  Acute left-sided low back pain without sciatica   Discharge Instructions   None    ED Prescriptions  None    PDMP not reviewed this encounter.

## 2023-12-31 NOTE — ED Triage Notes (Signed)
 Pt states that she has some left sided, mid back pain. X2-3 days  Pt is unsure of any injury.  Pt states that she has done hot and cold compresses which is not helping.  Pt states that the pain makes her nauseous.

## 2023-12-31 NOTE — Discharge Instructions (Signed)
 I have not gotten the official read from your x-ray yet but I am not seeing any obvious kidney stones.  I believe this may be muscular with swelling may be a muscle spasm.  I have prescribed some muscle relaxers to use.  We gave you Toradol  here for pain.  You can do extra strength Tylenol  and heat to the area.  Do not do ice at this time.  Try gently massaging the area. Follow up as needed.

## 2024-01-01 LAB — URINE CULTURE: Culture: <10000 — AB

## 2024-03-13 ENCOUNTER — Ambulatory Visit (HOSPITAL_BASED_OUTPATIENT_CLINIC_OR_DEPARTMENT_OTHER)
Admission: EM | Admit: 2024-03-13 | Discharge: 2024-03-13 | Disposition: A | Discharge status: Home / Self Care | Attending: Family Medicine | Admitting: Family Medicine

## 2024-03-13 ENCOUNTER — Encounter (HOSPITAL_BASED_OUTPATIENT_CLINIC_OR_DEPARTMENT_OTHER): Payer: Self-pay | Admitting: Emergency Medicine

## 2024-03-13 DIAGNOSIS — F419 Anxiety disorder, unspecified: Secondary | ICD-10-CM

## 2024-03-13 DIAGNOSIS — R079 Chest pain, unspecified: Secondary | ICD-10-CM | POA: Diagnosis not present

## 2024-03-13 DIAGNOSIS — I10 Essential (primary) hypertension: Secondary | ICD-10-CM | POA: Diagnosis not present

## 2024-03-13 DIAGNOSIS — R002 Palpitations: Secondary | ICD-10-CM | POA: Diagnosis not present

## 2024-03-13 MED ORDER — HYDROXYZINE HCL 25 MG PO TABS: 25.0000 mg | ORAL_TABLET | Freq: Four times a day (QID) | ORAL | 0 refills | Status: AC | PRN

## 2024-03-13 NOTE — ED Triage Notes (Signed)
 Pt woke up at 6 am with chest pain b/p 145/100 laid down rechecked b/p 112/80 woke back up had chest pain and b/p 148/106. Last B/p was 152/108 she is having a flutter feeling in her chest currently.

## 2024-03-13 NOTE — Discharge Instructions (Addendum)
 Chest pain with palpitations, elevated blood pressure and anxiety: Exam is normal.  Blood pressure is normal.  ECG is normal.  Educated on chest pain/cardiac pain.  Advised that if she has recurrent worsening chest pain, she needs to go to an emergency room where she can have not only an EKG but blood work and even a heart catheterization, if needed.  No medication for elevated blood pressure at this time.  Provided educational information about diet and lifestyle changes to improve blood pressure.  If elevated blood pressures persist needs to see family practice and get on medication for hypertension.  Even a small amount of weight loss (5 or 10 pounds) could lower and normalize the blood pressure without medication.  Anxiety: Patient's symptoms today could be anxiety related.  She has taken BuSpar in the past but did not find it helpful.  May need to see primary care and get on daily medication for anxiety.  For now may use hydroxyzine , 25 mg, 1 pill, every 6 hours if needed for anxiety.  Follow-up with primary care if symptoms do not improve, if symptoms worsen or if new symptoms occur.  See below for signs and symptoms of worsening condition and reasons to go to an emergency room.  Follow-up here as needed.  Get help right away if: You have pain in your chest, neck, arm, jaw, or back, and the pain: Happens more often. Lasts more than a few minutes. Goes away and comes back. Does not get better after you take medicine under your tongue. You're dizzy or light-headed all of a sudden. You faint. You have any combination of these problems: Cold sweats. Heartburn or upset stomach. Trouble breathing. Feeling like you may throw up, or you throw up. Feeling very tired or weak. Feeling worried or nervous. These symptoms may be an emergency. Call 911 right away. Do not wait to see if the symptoms will go away. Do not drive yourself to the hospital.

## 2024-03-13 NOTE — ED Provider Notes (Signed)
 " [facility]    CSN: [id number] Arrival date & time: 03/13/24  9050      History   Chief Complaint No chief complaint on file.   HPI Tracy Tracy is a 24 y.o. female.   24 year old female who woke up this morning at 6 AM with chest pain and almost a feeling of fluttering or palpitation in her chest.  It was central chest pain.  She did not have left arm pain and was not perspiring.  She took her blood pressure and her blood pressure was 145/100.  She laid back down and rechecked her blood pressure.  Recheck blood pressure after lying down a few minutes was 112/80.  She went back to sleep.  She woke up an hour or 2 later and she had chest pain again that woke her up.  Her blood pressure was 148/106.  Blood pressure before coming to the urgent care was 152/108.  At that time she was having some fluttering in her chest and chest pain.  She had the chest pain and fluttering in her chest at arrival to urgent care.  She denies fever, cough, nausea, vomiting, constipation, diarrhea.     Past Medical History:  Diagnosis Date   Anxiety    Asthma    Depression    Headache    hormone induced   Hypertension    Impetigo     Patient Active Problem List   Diagnosis Date Noted   Postpartum endometritis 04/25/2017   Anemia due to acute blood loss 04/25/2017   Status post primary low transverse cesarean section 04/24/2017   Preeclampsia, severe 04/19/2017    Past Surgical History:  Procedure Laterality Date   CESAREAN SECTION N/A 04/21/2017   Procedure: CESAREAN SECTION;  Surgeon: Timmie Norris, MD;  Location: [facility];  Service: Obstetrics;  Laterality: N/A;   TONSILLECTOMY      OB History     Gravida  1   Para  1   Term  1   Preterm  0   AB  0   Living  1      SAB  0   IAB  0   Ectopic  0   Multiple  0   Live Births  1            Home Medications    Prior to Admission medications  Medication Sig Start Date End Date  Taking? Authorizing Provider  etonogestrel (NEXPLANON) 68 MG IMPL implant once   Yes [provider]  hydrOXYzine  (ATARAX ) 25 MG tablet Take 1 tablet (25 mg total) by mouth every 6 (six) hours as needed for anxiety. 03/13/24  Yes Tracy Tracy    Family History Family History  Problem Relation Age of Onset   Hypertension Maternal Grandmother    Arthritis Maternal Grandmother    Depression Maternal Grandmother    Heart disease Maternal Grandfather    Hypertension Maternal Grandfather    Anemia Mother     Social History Social History[1]   Allergies   Amoxicillin and Penicillins   Review of Systems Review of Systems  Constitutional:  Negative for chills and fever.  HENT:  Negative for ear pain and sore throat.   Eyes:  Negative for pain and visual disturbance.  Respiratory:  Negative for cough and shortness of breath.   Cardiovascular:  Positive for chest pain and palpitations.  Gastrointestinal:  Negative for abdominal pain, constipation, diarrhea, nausea and vomiting.  Genitourinary:  Negative for dysuria  and hematuria.  Musculoskeletal:  Negative for arthralgias and back pain.  Skin:  Negative for color change and rash.  Neurological:  Negative for seizures and syncope.  All other systems reviewed and are negative.    Physical Exam Triage Vital Signs ED Triage Vitals  Encounter Vitals Group     BP 03/13/24 1002 130/85     Girls Systolic BP Percentile --      Girls Diastolic BP Percentile --      Boys Systolic BP Percentile --      Boys Diastolic BP Percentile --      Pulse Rate 03/13/24 1002 80     Resp 03/13/24 1002 18     Temp 03/13/24 1002 98 F (36.7 C)     Temp Source 03/13/24 1002 Oral     SpO2 03/13/24 1002 98 %     Weight --      Height --      Head Circumference --      Peak Flow --      Pain Score 03/13/24 1000 6     Pain Loc --      Pain Education --      Exclude from Growth Chart --    No data found.  Updated Vital Signs BP  130/85 (BP Location: Right Arm)   Pulse 80   Temp 98 F (36.7 C) (Oral)   Resp 18   SpO2 98%   Visual Acuity Right Eye Distance:   Left Eye Distance:   Bilateral Distance:    Right Eye Near:   Left Eye Near:    Bilateral Near:     Physical Exam Vitals and nursing note reviewed.  Constitutional:      General: She is not in acute distress.    Appearance: She is well-developed. She is not ill-appearing or toxic-appearing.  HENT:     Head: Normocephalic and atraumatic.     Right Ear: Hearing, tympanic membrane, ear canal and external ear normal.     Left Ear: Hearing, tympanic membrane, ear canal and external ear normal.     Nose: No congestion or rhinorrhea.     Right Sinus: No maxillary sinus tenderness or frontal sinus tenderness.     Left Sinus: No maxillary sinus tenderness or frontal sinus tenderness.     Mouth/Throat:     Lips: Pink.     Mouth: Mucous membranes are moist.     Pharynx: Uvula midline. No oropharyngeal exudate or posterior oropharyngeal erythema.     Tonsils: No tonsillar exudate.  Eyes:     Conjunctiva/sclera: Conjunctivae normal.     Pupils: Pupils are equal, round, and reactive to light.  Cardiovascular:     Rate and Rhythm: Normal rate and regular rhythm.     Heart sounds: S1 normal and S2 normal. No murmur heard. Pulmonary:     Effort: Pulmonary effort is normal. No respiratory distress.     Breath sounds: Normal breath sounds. No decreased breath sounds, wheezing, rhonchi or rales.  Abdominal:     General: Bowel sounds are normal.     Palpations: Abdomen is soft.     Tenderness: There is no abdominal tenderness.  Musculoskeletal:        General: No swelling.     Cervical back: Neck supple.  Lymphadenopathy:     Head:     Right side of head: No submental, submandibular, tonsillar, preauricular or posterior auricular adenopathy.     Left side of head: No submental, submandibular, tonsillar,  preauricular or posterior auricular adenopathy.      Cervical: No cervical adenopathy.     Right cervical: No superficial cervical adenopathy.    Left cervical: No superficial cervical adenopathy.  Skin:    General: Skin is warm and dry.     Capillary Refill: Capillary refill takes less than 2 seconds.     Findings: No rash.  Neurological:     Mental Status: She is alert and oriented to person, place, and time.  Psychiatric:        Mood and Affect: Mood normal.      UC Treatments / Results  Labs (all labs ordered are listed, but only abnormal results are displayed) Labs Reviewed - No data to display  EKG   Radiology No results found.  Procedures ED EKG  Date/Time: 03/13/2024 11:16 AM  Performed by: Tracy Tracy Authorized by: Tracy Tracy   ECG interpreted by ED Physician in the absence of a cardiologist: yes Tracy Ival, Tracy Tracy)   Previous ECG:    Previous ECG:  Compared to current   Comparison ECG info:  12/08/20.  Current ECG is normal.  Previous ECG was listed as NSR but could not rule out anterior infarct. Interpretation:    Interpretation: normal     Details:  NSR Rate:    ECG rate:  72   ECG rate assessment: normal   Rhythm:    Rhythm: sinus rhythm   Ectopy:    Ectopy: none   QRS:    QRS axis:  Right   QRS intervals:  Normal   QRS conduction: normal   ST segments:    ST segments:  Normal T waves:    T waves: normal   Q waves:    Abnormal Q-waves: not present    (including critical care time)  Medications Ordered in UC Medications - No data to display  Initial Impression / Assessment and Plan / UC Course  I have reviewed the triage vital signs and the nursing notes.  Pertinent labs & imaging results that were available during my care of the patient were reviewed by me and considered in my medical decision making (see chart for details).  Plan of Care (see discharge instructions for additional patient precautions and education): Chest pain with palpitations, elevated blood pressure  and anxiety: Exam is normal.  Blood pressure is normal.  ECG is normal.  Educated on chest pain/cardiac pain.  Advised that if she has recurrent worsening chest pain, she needs to go to an emergency room where she can have not only an EKG but blood work and even a heart catheterization, if needed.  No medication for elevated blood pressure at this time.  Provided educational information about diet and lifestyle changes to improve blood pressure.  If elevated blood pressures persist needs to see family practice and get on medication for hypertension.  Even a small amount of weight loss (5 or 10 pounds) could lower and normalize the blood pressure without medication.  Anxiety: Patient's symptoms today could be anxiety related.  She has taken BuSpar in the past but did not find it helpful.  May need to see primary care and get on daily medication for anxiety.  For now may use hydroxyzine , 25 mg, 1 pill, every 6 hours if needed for anxiety.  Follow-up with primary care if symptoms do not improve, if symptoms worsen or if new symptoms occur.  See discharge instructions for signs and symptoms of worsening condition and reasons to go to  an emergency room.  Follow-up here as needed.  I reviewed the plan of care with the patient and/or the patient's guardian.  The patient and/or guardian had time to ask questions and acknowledged that the questions were answered.  Final Clinical Impressions(s) / UC Diagnoses   Final diagnoses:  Elevated blood pressure reading with diagnosis of hypertension  Palpitations  Chest pain, unspecified type  Anxiety     Discharge Instructions      Chest pain with palpitations, elevated blood pressure and anxiety: Exam is normal.  Blood pressure is normal.  ECG is normal.  Educated on chest pain/cardiac pain.  Advised that if she has recurrent worsening chest pain, she needs to go to an emergency room where she can have not only an EKG but blood work and even a heart  catheterization, if needed.  No medication for elevated blood pressure at this time.  Provided educational information about diet and lifestyle changes to improve blood pressure.  If elevated blood pressures persist needs to see family practice and get on medication for hypertension.  Even a small amount of weight loss (5 or 10 pounds) could lower and normalize the blood pressure without medication.  Anxiety: Patient's symptoms today could be anxiety related.  She has taken BuSpar in the past but did not find it helpful.  May need to see primary care and get on daily medication for anxiety.  For now may use hydroxyzine , 25 mg, 1 pill, every 6 hours if needed for anxiety.  Follow-up with primary care if symptoms do not improve, if symptoms worsen or if new symptoms occur.  See below for signs and symptoms of worsening condition and reasons to go to an emergency room.  Follow-up here as needed.  Get help right away if: You have pain in your chest, neck, arm, jaw, or back, and the pain: Happens more often. Lasts more than a few minutes. Goes away and comes back. Does not get better after you take medicine under your tongue. You're dizzy or light-headed all of a sudden. You faint. You have any combination of these problems: Cold sweats. Heartburn or upset stomach. Trouble breathing. Feeling like you may throw up, or you throw up. Feeling very tired or weak. Feeling worried or nervous. These symptoms may be an emergency. Call 911 right away. Do not wait to see if the symptoms will go away. Do not drive yourself to the hospital.     ED Prescriptions     Medication Sig Dispense Auth. Provider   hydrOXYzine  (ATARAX ) 25 MG tablet Take 1 tablet (25 mg total) by mouth every 6 (six) hours as needed for anxiety. 12 tablet Irving Lubbers, Tracy Tracy      PDMP not reviewed this encounter.    [1]  Social History Tobacco Use   Smoking status: Never   Smokeless tobacco: Never  Vaping Use   Vaping  status: Every Day  Substance Use Topics   Alcohol use: No   Drug use: No     Tracy Tracy 03/13/24 1140  "
